# Patient Record
Sex: Female | Born: 1935 | Race: White | Hispanic: No | Marital: Married | Smoking: Never smoker
Health system: Southern US, Community
[De-identification: ages and names within clinical notes are randomized; demographics above are authoritative.]

## PROBLEM LIST (undated history)

## (undated) DIAGNOSIS — H409 Unspecified glaucoma: Secondary | ICD-10-CM

## (undated) DIAGNOSIS — K5792 Diverticulitis of intestine, part unspecified, without perforation or abscess without bleeding: Secondary | ICD-10-CM

## (undated) DIAGNOSIS — F0390 Unspecified dementia without behavioral disturbance: Secondary | ICD-10-CM

## (undated) DIAGNOSIS — N390 Urinary tract infection, site not specified: Secondary | ICD-10-CM

## (undated) HISTORY — PX: OTHER SURGICAL HISTORY: SHX169

## (undated) HISTORY — PX: EYE SURGERY: SHX253

---

## 2011-09-20 ENCOUNTER — Inpatient Hospital Stay: Payer: Self-pay | Admitting: Student

## 2011-09-20 LAB — COMPREHENSIVE METABOLIC PANEL
Alkaline Phosphatase: 62 U/L (ref 50–136)
Anion Gap: 16 (ref 7–16)
Bilirubin,Total: 0.5 mg/dL (ref 0.2–1.0)
Calcium, Total: 8.3 mg/dL — ABNORMAL LOW (ref 8.5–10.1)
Chloride: 97 mmol/L — ABNORMAL LOW (ref 98–107)
Co2: 21 mmol/L (ref 21–32)
Creatinine: 0.75 mg/dL (ref 0.60–1.30)
EGFR (African American): >60
Glucose: 147 mg/dL — ABNORMAL HIGH (ref 65–99)
Osmolality: 273 (ref 275–301)
Potassium: 2.9 mmol/L — ABNORMAL LOW (ref 3.5–5.1)
SGPT (ALT): 37 U/L
Sodium: 134 mmol/L — ABNORMAL LOW (ref 136–145)
Total Protein: 7.3 g/dL (ref 6.4–8.2)

## 2011-09-20 LAB — URINALYSIS, COMPLETE
Glucose,UR: NEGATIVE mg/dL (ref 0–75)
Nitrite: POSITIVE
Ph: 5 (ref 4.5–8.0)
Protein: 100
RBC,UR: 128 /HPF (ref 0–5)
Specific Gravity: 1.021 (ref 1.003–1.030)
WBC UR: 2003 /HPF (ref 0–5)

## 2011-09-20 LAB — CBC
HCT: 42.9 % (ref 35.0–47.0)
HGB: 14.3 g/dL (ref 12.0–16.0)
MCH: 31 pg (ref 26.0–34.0)
MCHC: 33.3 g/dL (ref 32.0–36.0)
MCV: 93 fL (ref 80–100)
RDW: 13.8 % (ref 11.5–14.5)

## 2011-09-20 LAB — PROTIME-INR: INR: 0.9

## 2011-09-20 LAB — LIPASE, BLOOD: Lipase: 69 U/L — ABNORMAL LOW (ref 73–393)

## 2011-09-21 LAB — BASIC METABOLIC PANEL
Anion Gap: 11 (ref 7–16)
BUN: 9 mg/dL (ref 7–18)
Calcium, Total: 7.8 mg/dL — ABNORMAL LOW (ref 8.5–10.1)
Co2: 23 mmol/L (ref 21–32)
EGFR (Non-African Amer.): >60
Potassium: 3 mmol/L — ABNORMAL LOW (ref 3.5–5.1)
Sodium: 133 mmol/L — ABNORMAL LOW (ref 136–145)

## 2011-09-21 LAB — CBC WITH DIFFERENTIAL/PLATELET
Basophil #: 0 10*3/uL (ref 0.0–0.1)
Basophil %: 0.2 %
Lymphocyte #: 2.4 10*3/uL (ref 1.0–3.6)
Lymphocyte %: 12.5 %
MCV: 94 fL (ref 80–100)
Monocyte #: 1.9 10*3/uL — ABNORMAL HIGH (ref 0.0–0.7)
Monocyte %: 9.9 %
Platelet: 173 10*3/uL (ref 150–440)
RDW: 13.6 % (ref 11.5–14.5)
WBC: 19.1 10*3/uL — ABNORMAL HIGH (ref 3.6–11.0)

## 2011-09-22 LAB — BASIC METABOLIC PANEL
Anion Gap: 13 (ref 7–16)
Calcium, Total: 7.5 mg/dL — ABNORMAL LOW (ref 8.5–10.1)
Chloride: 101 mmol/L (ref 98–107)
Co2: 25 mmol/L (ref 21–32)
EGFR (Non-African Amer.): >60
Glucose: 118 mg/dL — ABNORMAL HIGH (ref 65–99)
Osmolality: 276 (ref 275–301)
Potassium: 3.3 mmol/L — ABNORMAL LOW (ref 3.5–5.1)
Sodium: 139 mmol/L (ref 136–145)

## 2011-09-22 LAB — CBC WITH DIFFERENTIAL/PLATELET
Basophil %: 0.1 %
Eosinophil %: 0.1 %
MCH: 31 pg (ref 26.0–34.0)
MCHC: 33.2 g/dL (ref 32.0–36.0)
Monocyte #: 1.7 10*3/uL — ABNORMAL HIGH (ref 0.0–0.7)
Monocyte %: 9.2 %
Neutrophil %: 76.3 %
Platelet: 165 10*3/uL (ref 150–440)
RBC: 4.09 10*6/uL (ref 3.80–5.20)
RDW: 13.9 % (ref 11.5–14.5)
WBC: 18.8 10*3/uL — ABNORMAL HIGH (ref 3.6–11.0)

## 2011-09-23 LAB — CBC WITH DIFFERENTIAL/PLATELET
Basophil %: 0.4 %
Eosinophil #: 0.1 10*3/uL (ref 0.0–0.7)
Eosinophil %: 0.4 %
HCT: 36.6 % (ref 35.0–47.0)
HGB: 12.5 g/dL (ref 12.0–16.0)
Lymphocyte %: 20.3 %
Neutrophil #: 11.6 10*3/uL — ABNORMAL HIGH (ref 1.4–6.5)
Neutrophil %: 71.7 %
WBC: 16.2 10*3/uL — ABNORMAL HIGH (ref 3.6–11.0)

## 2011-09-25 LAB — CBC WITH DIFFERENTIAL/PLATELET
Basophil %: 0.4 %
Eosinophil #: 0.1 10*3/uL (ref 0.0–0.7)
Eosinophil %: 1.5 %
HCT: 37.4 % (ref 35.0–47.0)
Lymphocyte %: 38.4 %
MCH: 31 pg (ref 26.0–34.0)
MCV: 92 fL (ref 80–100)
Monocyte %: 12.2 %
Neutrophil #: 3.7 10*3/uL (ref 1.4–6.5)
Neutrophil %: 47.5 %
RDW: 13.8 % (ref 11.5–14.5)
WBC: 7.7 10*3/uL (ref 3.6–11.0)

## 2012-04-13 DIAGNOSIS — H401190 Primary open-angle glaucoma, unspecified eye, stage unspecified: Secondary | ICD-10-CM | POA: Insufficient documentation

## 2012-04-13 DIAGNOSIS — H2510 Age-related nuclear cataract, unspecified eye: Secondary | ICD-10-CM | POA: Insufficient documentation

## 2012-09-03 DIAGNOSIS — B029 Zoster without complications: Secondary | ICD-10-CM | POA: Insufficient documentation

## 2012-09-03 DIAGNOSIS — R4181 Age-related cognitive decline: Secondary | ICD-10-CM | POA: Insufficient documentation

## 2012-09-03 DIAGNOSIS — I10 Essential (primary) hypertension: Secondary | ICD-10-CM | POA: Insufficient documentation

## 2012-09-03 DIAGNOSIS — K5790 Diverticulosis of intestine, part unspecified, without perforation or abscess without bleeding: Secondary | ICD-10-CM | POA: Insufficient documentation

## 2012-09-14 DIAGNOSIS — Z961 Presence of intraocular lens: Secondary | ICD-10-CM | POA: Insufficient documentation

## 2012-09-22 DIAGNOSIS — H2510 Age-related nuclear cataract, unspecified eye: Secondary | ICD-10-CM | POA: Insufficient documentation

## 2012-10-11 ENCOUNTER — Observation Stay: Payer: Self-pay | Admitting: Internal Medicine

## 2012-10-11 LAB — CBC
HGB: 13.5 g/dL (ref 12.0–16.0)
MCV: 92 fL (ref 80–100)
RDW: 13.7 % (ref 11.5–14.5)
WBC: 9.4 10*3/uL (ref 3.6–11.0)

## 2012-10-11 LAB — MAGNESIUM: Magnesium: 1.8 mg/dL

## 2012-10-11 LAB — BASIC METABOLIC PANEL
Anion Gap: 3 — ABNORMAL LOW (ref 7–16)
BUN: 19 mg/dL — ABNORMAL HIGH (ref 7–18)
Calcium, Total: 8.7 mg/dL (ref 8.5–10.1)
Chloride: 103 mmol/L (ref 98–107)
Osmolality: 278 (ref 275–301)
Potassium: 3.4 mmol/L — ABNORMAL LOW (ref 3.5–5.1)
Sodium: 138 mmol/L (ref 136–145)

## 2012-10-11 LAB — TROPONIN I: Troponin-I: <0.02 ng/mL

## 2012-10-12 DIAGNOSIS — R079 Chest pain, unspecified: Secondary | ICD-10-CM

## 2012-10-12 LAB — LIPID PANEL
Ldl Cholesterol, Calc: 131 mg/dL — ABNORMAL HIGH (ref 0–100)
Triglycerides: 100 mg/dL (ref 0–200)
VLDL Cholesterol, Calc: 20 mg/dL (ref 5–40)

## 2012-10-12 LAB — CBC WITH DIFFERENTIAL/PLATELET
Basophil #: 0.1 10*3/uL (ref 0.0–0.1)
Basophil %: 0.7 %
Eosinophil #: 0.2 10*3/uL (ref 0.0–0.7)
Eosinophil %: 2.3 %
HCT: 38.4 % (ref 35.0–47.0)
Lymphocyte #: 4.2 10*3/uL — ABNORMAL HIGH (ref 1.0–3.6)
Lymphocyte %: 48.5 %
Monocyte #: 0.8 x10 3/mm (ref 0.2–0.9)
Neutrophil %: 39.4 %
Platelet: 198 10*3/uL (ref 150–440)
RBC: 4.11 10*6/uL (ref 3.80–5.20)
RDW: 13.5 % (ref 11.5–14.5)
WBC: 8.7 10*3/uL (ref 3.6–11.0)

## 2012-10-12 LAB — BASIC METABOLIC PANEL
BUN: 17 mg/dL (ref 7–18)
Chloride: 107 mmol/L (ref 98–107)
Co2: 29 mmol/L (ref 21–32)
Creatinine: 0.75 mg/dL (ref 0.60–1.30)
EGFR (Non-African Amer.): >60
Glucose: 106 mg/dL — ABNORMAL HIGH (ref 65–99)

## 2013-11-17 ENCOUNTER — Ambulatory Visit: Payer: PRIVATE HEALTH INSURANCE | Admitting: Podiatrist

## 2013-11-17 ENCOUNTER — Ambulatory Visit (INDEPENDENT_AMBULATORY_CARE_PROVIDER_SITE_OTHER): Payer: Medicare Other | Admitting: Podiatrist

## 2013-11-17 VITALS — BP 149/72 | HR 75 | Resp 16

## 2013-11-17 DIAGNOSIS — M2010 Hallux valgus (acquired), unspecified foot: Secondary | ICD-10-CM

## 2013-11-17 DIAGNOSIS — B351 Tinea unguium: Secondary | ICD-10-CM

## 2013-11-17 DIAGNOSIS — M79609 Pain in unspecified limb: Secondary | ICD-10-CM

## 2013-11-17 DIAGNOSIS — M2040 Other hammer toe(s) (acquired), unspecified foot: Secondary | ICD-10-CM

## 2013-11-17 DIAGNOSIS — M216X9 Other acquired deformities of unspecified foot: Secondary | ICD-10-CM

## 2013-11-17 DIAGNOSIS — L84 Corns and callosities: Secondary | ICD-10-CM

## 2013-11-17 NOTE — Patient Instructions (Signed)
Try wearing the pad between the great toe and the 2nd toe especially in shoes.  Also try out the pad in your shoe insert-- if it uncomfortable, you can move it around to find where it feels comfortable, put it on the bottom of the insert instead of the top or, remove it altogether.

## 2013-11-17 NOTE — Progress Notes (Signed)
Chief Complaint  Patient presents with  . Follow-up    callus, toenails and rotation of the hallux     HPI: Patient is 80 y.o. female who presents today for elongated and painful toenails,  Painful callus submet 2 right, hammertoe 2nd right, and laterally deviated hallux right.  The patients husband accompanies her at todays visit.   He relates the right great to is shifting closer to the 2nd toe and starting to cause her pain.  She in turn has a hammertoe on the 2nd that is worsening and a callus submet 2 right that has become painful. He has cut a hole out of her insert in her shoe to try to relieve the pressure.       Physical Exam GENERAL APPEARANCE: Alert. Appropriately groomed. No acute distress.  VASCULAR: Pedal pulses palpable at 2/4 DP and1/4 PT bilateral.  Capillary refill time is immediate to all digits,  Proximal to distal cooling it warm to warm.   NEUROLOGIC: sensation is intact epicritically and protectively to 5.07 monofilament at 5/5 sites bilateral.  Light touch is intact bilateral, vibratory sensation intact bilateral, achilles tendon reflex is intact bilateral.  MUSCULOSKELETAL: hallux abducto valgus deformity with valgus tilt of the hallux right greater than left.  Contracture of the 2nd toe right is present.  Prominent 2nd metatarsal head with callus deformity present.  3rd toe right is also contracted.  Crowding of the lesser digits is present.    DERMATOLOGIC: callus submet 2 of the right foot is present.  Intact integument is present with debridement.  No other lesions present.  Toenails 1-5 bilateral are thick, yellow, dystrophic and painful with debridement and ambulation.   Assessment: prominent metatarsal 2nd right with callus formation and pain.  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 Sharp debridement of the callus was also accomplished without incident.  I offloaded the 2nd metatarsal  with a cut out pad and discussed surgical options for the 2nd toe including bunion surgery and straightening the 2nd toe versus amputation.  She will be seen back for routine care as needed and will call if the toe becomes painful and she would like to consider her surgical options.

## 2014-05-02 ENCOUNTER — Encounter: Payer: Self-pay | Admitting: Podiatry

## 2014-05-02 ENCOUNTER — Ambulatory Visit (INDEPENDENT_AMBULATORY_CARE_PROVIDER_SITE_OTHER): Payer: Medicare Other | Admitting: Podiatry

## 2014-05-02 VITALS — BP 141/81 | HR 76 | Resp 16

## 2014-05-02 DIAGNOSIS — B351 Tinea unguium: Secondary | ICD-10-CM

## 2014-05-02 DIAGNOSIS — M79676 Pain in unspecified toe(s): Secondary | ICD-10-CM

## 2014-05-02 DIAGNOSIS — L84 Corns and callosities: Secondary | ICD-10-CM | POA: Diagnosis not present

## 2014-05-02 DIAGNOSIS — R21 Rash and other nonspecific skin eruption: Secondary | ICD-10-CM

## 2014-05-02 DIAGNOSIS — M216X1 Other acquired deformities of right foot: Secondary | ICD-10-CM

## 2014-05-02 NOTE — Progress Notes (Signed)
Patient ID: Linda Elliott, female   DOB: [birth year], 80 y.o.   MRN: [id number]  Subjective: Ms. Linda Elliott, 80 year old female, returns fianc with complaints of painful elongated toenails as well as painful callus right foot submetatarsal 2. Patient also has new complaints of a rash on the front of the right ankle for which husband states isn't present for approximately one year and has been intermittent. They previously has seen a dermatologist she was told that it was due to dryness. They have attempted over-the-counter moisturizers and some steroid creams without any resolution. She states that it itches periodically it has not changed much in size or color. Denies any ulceration to the area. No other complaints at this time. Denies any systemic complaints of fevers, chills, nausea, vomiting. No acute changes since last appointment.  Objective: AAO 3, NAD DP/PT pulses palpable bilaterally, CRT less than 3 seconds Protective sensation intact with Simms Weinstein monofilament, vibratory sensation intact, Achilles tendon reflex intact. 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 Hammertoe contracture to the second digit with prominent second metatarsal head plantarly. There is a hyperkeratotic lesion submetatarsal 2 on the right foot. Nails hypertrophic, dystrophic, elongated, brittle, yellow discoloration without any surrounding erythema or drainage. There is a dry, scaly rash on the anterior aspect of the right ankle with a slight erythematous base. There is no ascending cellulitis or increased warmth over the area. No open lesions. No calf pain, swelling, warmth, erythema.  Assessment: 80 year old female with symptoms of chronic onychomycosis, symptomatic right submetatarsal 2 hyperkeratotic lesion and rash right anterior ankle.  Plan: -Treatment options discussed including alternatives, risks, complications. -Nail sharply debrided 10 without complication -Hyperkeratotic lesion sharply  debrided 1 without complications -For the rash on the right anterior ankle discussed using a moisturizer under occlusion. If that does not help relieve symptoms we'll consider Kenalog cream. If symptoms persist we'll consider biopsy. Patient has already seen a dermatologist without any changes. -Follow-up in 3 months or sooner if any problems are to arise or any change in symptoms. In the meantime call the office with any questions, concerns.

## 2014-08-01 ENCOUNTER — Ambulatory Visit (INDEPENDENT_AMBULATORY_CARE_PROVIDER_SITE_OTHER): Payer: Medicare Other | Admitting: Podiatry

## 2014-08-01 DIAGNOSIS — M79676 Pain in unspecified toe(s): Secondary | ICD-10-CM

## 2014-08-01 DIAGNOSIS — R21 Rash and other nonspecific skin eruption: Secondary | ICD-10-CM

## 2014-08-01 DIAGNOSIS — B351 Tinea unguium: Secondary | ICD-10-CM

## 2014-08-01 MED ORDER — CLOTRIMAZOLE-BETAMETHASONE 1-0.05 % EX CREA: 1.0000 "application " | TOPICAL_CREAM | Freq: Two times a day (BID) | CUTANEOUS | Status: DC

## 2014-08-01 NOTE — Progress Notes (Signed)
Patient ID: Linda PonderBetty Elliott, female   DOB: [birth year], 81 y.o.   MRN: [id number]  Subjective: 81 year old female returns to the office today with her husband for painful, elongated, thick toenails. The patient's last husband states that they are unable to trim the nails himself. She states the nails rub in shoes causing discomfort. She denies any recent redness or drainage from the nail sites. She continues to have a mild rash on the anterior aspect of the right ankle for which the patient's husband is is tried numerous moisturizers as well as steroid creams. He states the area has somewhat improved although it does continue. He is inquiring about possible fungal treatment. No other complaints at this time no acute changes since last appointment.  Objective: AAO 3, NAD DP/PT pulses palpable bilaterally, CRT less than 3 seconds Protective sensation appears to be intact with Dorann OuSimms Weinstein monofilament, vibratory sensation intact, Achilles tendon reflex intact. Nails are hypertrophic, dystrophic, lytic, brittle, discolored 10. No swelling erythema or drainage. There subjective tenderness all 10 toenails. There is a dry, scaly rash on the anterior aspect of the right ankle with slight erythematous base which the patient states itches. There is no draining, open lesions. No increase in warmth or edema. Is no signs of bacterial infection. Hyperkeratotic lesion right foot submetatarsal 2 with prominent metatarsal heads bilaterally atrophy of the fat pad. No other open lesions or pre-ulcer lesions identified. No interdigital maceration. Bilateral HAV deformity right greater the left with hammertoe contractures. No pain with calf compression, swelling, warmth, erythema.  Assessment: [age] year old female with symptomatic onychomycosis, symptomatic submetatarsal 2 hyperkeratotic lesion on the right foot, anterior ankle rash on the right possible fungal.  Plan: -Treatment options were discussed the patient  including alternatives, risks, complications. -Nail sharply debrided 10 without complication/bleeding. -Hyperkeratotic lesion sharply debrided right foot 1 without complications bleeding. -As the rash on the anterior aspect of the right ankle was not completely resolved with moisturizers and topical steroids we will try antifungal treatment although would be unlikely for this area. Lotrisone was prescribed. If this is not cleared up will follow-up with dermatology. -Follow-up in 3 months or sooner should any problems arise. In the meantime, call the office with any questions, concerns, changes symptoms

## 2014-09-09 ENCOUNTER — Emergency Department: Payer: Self-pay | Admitting: Emergency Medicine

## 2014-10-27 NOTE — H&P (Signed)
PATIENT NAME:  Linda Elliott, Linda Elliott MR#:  [id number] DATE OF BIRTH:  Mar 31, 1923  DATE OF ADMISSION:  10/11/2012  PRIMARY CARE PHYSICIAN: Glory RosebushLisa Elmrich, MD in [place]   CHIEF COMPLAINT: Chest pain.   HISTORY OF PRESENT ILLNESS: This is an 81 year old female with mild dementia. Most of the history is obtained from the husband, who states that at 3:00 p.m. she was not feeling well. She had a dull ache in her chest, 5 out of 10 in intensity, with difficulty breathing, diaphoretic, no nausea. The pain radiated up into the left neck, jaw and arm, and her blood pressure at that time was 180/80, pulse went up to 84. They gave aspirin and called EMS. The patient does have some residual left shoulder pain. She feels a little hazy and has a dull ache in the left upper chest. No complaints of shortness of breath at this point. No cough. No fever. In the ER, the patient had a cardiac enzyme that was negative. EKG did not show anything acute. Hospitalist services were contacted for further evaluation.   PAST MEDICAL HISTORY: Mild dementia, hypertension.   PAST SURGICAL HISTORY: Cataracts, cholecystectomy and appendectomy.   ALLERGIES: PENICILLIN.   CURRENT MEDICATIONS: Include Namenda 5 mg daily, hydrochlorothiazide 12.5 mg daily.   SOCIAL HISTORY: No smoking. No alcohol. No drug use. She lives with her husband. She was a Runner, broadcasting/film/videoteacher, and she has a PhD in psychology.   FAMILY HISTORY: Mother died at 8085 of a MI, had hypertension and heart disease. Father unknown.   REVIEW OF SYSTEMS: CONSTITUTIONAL: Positive for diaphoresis. No fever. No chills. No weight gain. No weight loss. No fatigue.  EYES: She did have cataract surgery 3hree weeks ago, does wear reading glasses.  EARS, NOSE, MOUTH AND THROAT: No hearing loss. No sore throat. No difficulty swallowing.  CARDIOVASCULAR: Positive for chest pain. No palpitations.  RESPIRATORY: Positive for shortness of breath. No cough. No sputum. No hemoptysis.  GASTROINTESTINAL:  No nausea. No vomiting. No abdominal pain. No diarrhea. No constipation. No bright red blood per rectum. No melena.  GENITOURINARY: No burning on urination, no hematuria.  MUSCULOSKELETAL: Positive for shoulder pain.  INTEGUMENT: No rashes or eruptions.  NEUROLOGICAL: No fainting or blackouts.  PSYCHIATRIC: No anxiety or depression.  ENDOCRINE: No thyroid problems.  HEMATOLOGIC/LYMPHATIC: No anemia. No easy bruising or bleeding.   PHYSICAL EXAMINATION: VITAL SIGNS: Temperature 98.2, pulse 65, respirations 18, blood pressure 156/78, pulse oximetry 97% on room air.  GENERAL: No respiratory distress.  HEENT: Eyes: Conjunctivae and lids normal. Pupils are equal, round, reactive to light. Extraocular muscles are intact. No nystagmus. Ears, nose, mouth, and throat: Tympanic membranes no erythema. Nasal mucosa no erythema. Throat no erythema, no exudate seen. Lips and gums no lesions.  NECK: No JVD. No bruits. No lymphadenopathy. No thyromegaly. No thyroid nodules palpated.  RESPIRATORY: Lungs are clear to auscultation. No use of accessory muscles to breathe. No rhonchi, rales or wheeze heard.  CARDIOVASCULAR: S1, S2 normal. A 2/6 systolic ejection murmur. Carotid upstroke 2+ bilaterally. No bruits.  EXTREMITIES: Dorsalis pedis pulses 2+ bilaterally. No edema of the lower extremity.  ABDOMEN: Soft, nontender. No organomegaly/splenomegaly. Normoactive bowel sounds. No masses felt.  LYMPHATIC: No lymph nodes in the neck.  MUSCULOSKELETAL: No clubbing, edema or cyanosis.  SKIN: No rashes or ulcers seen.  NEUROLOGIC: Cranial nerves II through XII are grossly intact. Deep tendon reflexes 1+ bilateral lower extremity.  PSYCHIATRIC: The patient is oriented to person and place. Answers yes or no questions appropriately.  CHEST WALL: Positive pain to palpation over the left upper chest. Slight pain over the clavicle and shoulder joint.   ASSESSMENT AND PLAN: 1. Chest pain: Husband did give a good story,  but pain is also musculoskeletal, unclear if this is cardiac or not. We will admit as an observation, get serial cardiac enzymes to rule out myocardial infarction. If enzymes are negative, we will get a stress test in the a.m. Aspirin was given by EMS. We will continue aspirin on a daily basis. If cardiac enzymes turn positive, we will consider cardiology consultation. No beta blocker secondary to relative bradycardia.  2. Hypertension: On low-dose hydrochlorothiazide.  3. Mild dementia: On Namenda.  4. Hypokalemia: I will give potassium supplementation and recheck in a.m. Magnesium is normal range.   CODE STATUS: The patient is a DO NOT RESUSCITATE.   TIME SPENT ON ADMISSION: 50 minutes.  ____________________________ Herschell Dimes. Renae Gloss, MD rjw:cb D: 10/11/2012 19:52:24 ET T: 10/11/2012 20:04:34 ET JOB#: [id number]  cc: Herschell Dimes. Renae Gloss, MD, <Dictator> Glory Rosebush, MD in [facility] Jeanella Anton MD ELECTRONICALLY SIGNED 10/12/2012 13:52

## 2014-10-27 NOTE — Discharge Summary (Signed)
PATIENT NAME:  Linda Elliott, Linda Elliott MR#:  [id number] DATE OF BIRTH:  [birth year]  DATE OF ADMISSION:  10/11/2012 DATE OF DISCHARGE:  10/12/2012  PRESENTING COMPLAINT: Chest pain.   DISCHARGE DIAGNOSES: 1.  Chest pain, appears noncardiac, resolved.  2.  Hypertension.  3.  Cognitive decline/early dementia.   CONDITION ON DISCHARGE: Fair.   CODE STATUS: No code, DO NOT RESUSCITATE.   DISCHARGE MEDICATIONS: 1.  Aspirin 81 mg daily.  2.  Namenda 5 mg daily.  3.  Hydrochlorothiazide 12.5 mg daily.   FOLLOW UP:  [facility] in 1 to 2 weeks.   LABORATORY AND DIAGNOSTIC DATA:   1.  Cardiac enzymes x 3 negative.  2.  Myoview stress test within normal limits along with ejection fraction of 55%.  3.  CBC and metabolic panel within normal limits.  4.  LDL 131, cholesterol is 209. 5.  Cardiac enzymes x 3 negative.  6.  Magnesium 1.8.  7.  EKG shows junctional rhythm.   BRIEF SUMMARY OF HOSPITAL COURSE:  The patient is a very pleasant 81 year old Caucasian female with history of hypertension, who comes to the Emergency Room with complaints of chest:   1.  Chest pain. The patient presented with left-sided chest pain radiating to her left arm along with some diaphoresis. No shortness of breath. She was admitted on the medical floor. She remained in sinus rhythm. Cardiac enzymes were negative x 3. She was ruled out for acute myocardial infarction. She remained chest pain free thereafter p.o. P.r.n. nitroglycerin and aspirin were given. She was continued on her hydrochlorothiazide. She was subjected to Myoview stress test which was essentially negative.  2.  Hypertension. Continued low dose hydrochlorothiazide.  3. Mild dementia. Hospital stay otherwise remained stable.  4.  CODE STATUS: The patient remained a no code, DO NOT RESUSCITATE  5.  [facility].   TIME SPENT: 40 minutes.     ____________________________ Wylie HailSona A. Allena KatzPatel,  MD sap:ct D: 10/13/2012 07:32:30 ET T: 10/13/2012 07:43:05 ET JOB#: [id number]  cc: Kimba Lottes A. Allena KatzPatel, MD, <Dictator> Willow OraSONA A Tamina Cyphers MD ELECTRONICALLY SIGNED 10/13/2012 14:19

## 2014-10-29 NOTE — Discharge Summary (Signed)
PATIENT NAME:  Linda Elliott, Linda Elliott MR#:  [id number] DATE OF BIRTH:  [birth year]  DATE OF ADMISSION:  09/20/2011 DATE OF DISCHARGE:  09/25/2011  CONSULTANT: Lurline DelShaukat Iftikhar, MD - Gastroenterology.   CHIEF COMPLAINT: Abdominal pain, nausea, vomiting, and diarrhea.   DISCHARGE DIAGNOSES:  1. Colitis, likely infectious. 2. Hypertension. 3. Diverticulitis. 4. Urinary tract infections. 5. Hypokalemia.   DISCHARGE MEDICATIONS:  1. HCTZ 10 mg daily.  2. Cipro 500 mg twice a day for 4 days. 3. Flagyl 500 mg three times daily for 4 days. 4. Nitrofurantoin 100 mg p.o. twice a day for three days. 5. K-Lor 20 mEq daily for seven days.   DIET: Low sodium, ADA diet.   ACTIVITY: As tolerated.   DISCHARGE FOLLOWUP: Please follow up with your PCP within 1 to 2 weeks and check electrolytes in 1 week.  HISTORY OF PRESENT ILLNESS: For full details, please see history and physical dictated on 09/20/2011, but briefly this is an 81 year old female who came in with the chief complaint as stated above. She was found with colitis per CAT scan and admitted to the hospitalist service for further evaluation and management. Furthermore, she was hypokalemic.   SIGNIFICANT LABS/IMAGING: Initial potassium 2.9, sodium 134, creatinine 0.75, glucose 147. Potassium on discharge 3.9. Magnesium on arrival 1.3. Lipase 69. LFTs: AST 49 otherwise within normal limits. Troponin negative. WBC 10.6 initially then went to 19.1. By discharge it had normalized to 7.7. Hemoglobin on arrival 14.3, hematocrit 42.9, and platelets 166. INR 0.9.  Urine cultures showed Escherichia coli sensitive to nitrofurantoin.   Stool cultures were negative.   Clostridium difficile cultures were negative.   Urinalysis was suggestive of infection with 3+ leukocyte esterase, positive nitrites, 2003 WBCs, and 3+ bacteria.   CT scan of the abdomen and pelvis with contrast showed bowel wall thickening involving the ascending and transverse colon concerning  for colitis secondary to infectious or inflammatory etiology, less likely ischemic. Also haziness around the bladder, possibly cystitis.   HOSPITAL COURSE: The patient was admitted to the hospitalist service and initially started on Cipro and Flagyl and Gastroenterology was consulted. The patient had stool cultures as well as urine cultures sent. The culture for stool has been all negative and the urine cultures grew Escherichia coli. She did have abdominal pain which was generalized, more on the right and also upper epigastric. She was seen by Gastroenterology and the recommendation was to continue the Cipro and Flagyl. Slowly her GI symptoms resolved. She likely had infectious colitis. She was started on clear liquids and advanced. Hemoglobin and hematocrit has been stable. At this point, she has no further symptoms of nausea, vomiting, or diarrhea and is tolerating her p.o. She will be discharged with additional four days of Cipro and Flagyl. In regards to the hypokalemia, that was repleted p.r.n. and she will be discharged on seven days of replacement therapy. The patient also did have a urinary tract infection and she will be discharged on nitrofurantoin for that. The patient has been passing stool and gas and, at this time, she will be discharged with outpatient follow-up.   CODE STATUS: FULL CODE.          DISPOSITION: Home   TOTAL TIME SPENT: 35 minutes.  ____________________________ Krystal EatonShayiq Zamarian Scarano, MD sa:slb D: 09/25/2011 17:05:32 ET T: 09/26/2011 10:08:19 ET JOB#: [id number]  cc: Krystal EatonShayiq Aylana Hirschfeld, MD, <Dictator> Krystal EatonSHAYIQ Ahmet Schank MD ELECTRONICALLY SIGNED 10/12/2011 15:06

## 2014-10-29 NOTE — Consult Note (Signed)
Chief Complaint:   Subjective/Chief Complaint Still c/o mild abdominal pain. No vomiting, diarrhea or bleeding. H and H normal. Stool studies negative for C. diff. Urine c/s consistent with UTI.  Impression: Probable infectious colitis. LGI bleed, resolved most likely hemorrhoidal vs from colitis. No signs of active bleeding.  Recommendations: Continue current antibiotics. Slowly advance diet. patient will require a colonoscopy at a later date. Will follow.   Electronic Signatures: Lurline DelIftikhar, Okechukwu Regnier (MD)  (Signed 18-Mar-13 18:51)  Authored: Chief Complaint   Last Updated: 18-Mar-13 18:51 by Lurline DelIftikhar, Darrill Vreeland (MD)

## 2014-10-29 NOTE — H&P (Signed)
PATIENT NAME:  Linda Elliott, Aury MR#:  [id number] DATE OF BIRTH:  1922/09/03  DATE OF ADMISSION:  09/20/2011  ER REFERRING PHYSICIAN: Dana AllanMarwan Powers, MD  CHIEF COMPLAINT: Abdominal pain, nausea, vomiting, and diarrhea.   HISTORY OF PRESENT ILLNESS: This is an 81 year old female who three days ago started not feeling well. Yesterday she developed some diarrhea with some blood in it last night. She has had a little bit of vomiting and this morning she had some left-sided pain. She has a history of diverticulitis in the past. Her husband who is a physician thought that might be what was going on and brought her into the emergency room. A CT scan showed she had colitis and she is dehydrated and hypokalemic so we are going to go ahead and admit her for further treatment.   PAST MEDICAL HISTORY:  1. Hypertension.  2. Diverticulitis.  PAST SURGICAL HISTORY:  1. Appendectomy.  2. Cholecystectomy.   ALLERGIES: Penicillin.   CURRENT MEDICATIONS: Hydrochlorothiazide 12.5 mg daily.   SOCIAL HISTORY: Does not smoke and does not drink alcohol.   FAMILY HISTORY: Her mother had congestive heart failure.   REVIEW OF SYSTEMS: CONSTITUTIONAL: No fever or chills. EYES: No blurred vision. ENT: No hearing loss. CARDIOVASCULAR: No chest pain. PULMONARY: No shortness of breath. GI: No nausea, vomiting, or diarrhea. GU: No dysuria. ENDOCRINE: No heat or cold intolerance. INTEGUMENT: No rash. MUSCULOSKELETAL: Occasional joint pain. NEUROLOGIC: No numbness or weakness.   PHYSICAL EXAMINATION:   VITAL SIGNS: Temperature 97.1, pulse 75, respirations 18, blood pressure 156/62.   GENERAL: This is a well-nourished white female in no acute distress.   HEENT: The pupils are equal, round, and reactive to light. Sclerae are anicteric. Oral mucosa is moist. Oropharynx is clear. Nasopharynx is clear.   NECK: Supple. No JVD, lymphadenopathy or thyromegaly.   CARDIOVASCULAR: Regular rate and rhythm. No murmurs, rubs, or  gallops.   LUNGS: Clear to auscultation. No dullness to percussion. She is not using accessory muscles.   ABDOMEN: Soft and nondistended. Bowel sounds are positive. No hepatosplenomegaly. No masses. There is some mild tenderness in the left lower quadrant with no rebound or guarding.   EXTREMITIES: No edema.   NEUROLOGIC: Cranial nerves II through XII are intact. She is alert and oriented x4, mildly hard of hearing.   SKIN: Moist with no rash.   LABS/STUDIES: CT scan of the abdomen shows colitis in the transverse and descending colon.   Urinalysis is nitrite positive.   White blood cells 10.6 and hemoglobin 14.3. BUN 18, creatinine 0.75, sodium 134, and potassium 2.9.   ASSESSMENT AND PLAN:  1. Colitis: Suspect this is from infectious source. I will go ahead and put her on IV Cipro and Flagyl, especially since she has had some nausea and vomiting and not able to take p.o. well. We will test her stool for culture and for Clostridium difficile colitis.  2. Hypokalemia: We will give her IV potassium in her fluids. She was given a large dose of p.o. potassium here in the emergency room. We will recheck it. Also check a magnesium.  3. Gastrointestinal bleeding: I suspect this is likely from the colitis. We will monitor her hemoglobin. If it drops would get gastroenterology involved at that point. If not I suspect this will resolve with treatment of the colitis.  4. Urinary tract infection: The Cipro should cover for this. We will go ahead and culture her urine and adjust antibiotics as needed.  5. Hypertension: We will continue her hydrochlorothiazide.  TIME SPENT ON ADMISSION: 45 minutes. ____________________________ Gracelyn Nurse, MD jdj:slb D: 09/20/2011 12:33:42 ET T: 09/20/2011 12:50:30 ET JOB#: [id number]  cc: Gracelyn Nurse, MD, <Dictator> Gracelyn Nurse MD ELECTRONICALLY SIGNED 09/20/2011 15:15

## 2014-10-29 NOTE — Consult Note (Signed)
Brief Consult Note: Diagnosis: Colitis.   Patient was seen by consultant.   Comments: Patient with left sided colitis most likely infectious. Ischemic colitis is less likely. Minimal lower GI bleed either secondary to colitis or more likely hemorrhoidal.  Recommendations: Agree with current antibiotics. Clear liquid diet. Will follow.  Electronic Signatures: Lurline DelIftikhar, Takyra Cantrall (MD)  (Signed 17-Mar-13 19:14)  Authored: Brief Consult Note   Last Updated: 17-Mar-13 19:14 by Lurline DelIftikhar, Markeda Narvaez (MD)

## 2014-10-29 NOTE — Consult Note (Signed)
Chief Complaint:   Subjective/Chief Complaint No diarrhea and no bleeding. WBC count improving. Abdomen is soft and benign.  Recommendations: Advance diet. Continue cipro and flagyl for a total of 10-14 days. Patient to follow with me in 2 weeks after dischargre. Will sign off. Thanks.   Electronic Signatures: Lurline DelIftikhar, Markeisha Mancias (MD)  (Signed 19-Mar-13 22:11)  Authored: Chief Complaint   Last Updated: 19-Mar-13 22:11 by Lurline DelIftikhar, Deantae Shackleton (MD)

## 2014-10-29 NOTE — Consult Note (Signed)
PATIENT NAME:  Linda Elliott, Linda Elliott MR#:  [id number] DATE OF BIRTH:  07/01/1923  DATE OF CONSULTATION:  09/22/2011  REFERRING PHYSICIAN:   CONSULTING PHYSICIAN:  Lurline DelShaukat Abhishek Levesque, MD  REASON FOR CONSULTATION:  Abdominal pain, vomiting, diarrhea, and bleeding.   HISTORY OF PRESENT ILLNESS: An 81 year old female. She was admitted to the hospital day before yesterday, as she was not feeling well and had some diarrhea with bright red blood per rectum. According to the patient, she was in her normal state of health until about a couple of days ago when she started to have some left lower quadrant abdominal pain associated with mild nausea, vomiting, as well as some diarrhea. Some bright red blood was noted in the stool.  A CT scan of the abdomen and pelvis showed probable colitis, mainly in the right colon. Diverticula were noted in the left colon without signs of acute diverticulitis.   PAST MEDICAL HISTORY: History of hypertension and diverticulitis.   PAST SURGICAL HISTORY:  Appendectomy and cholecystectomy. The patient does not believe she has ever had a colonoscopy.   ALLERGIES: Penicillin.  MEDICATIONS AT HOME: Hydrochlorothiazide.   SOCIAL HISTORY: She does not smoke or drink.   FAMILY HISTORY: Grossly unremarkable.   REVIEW OF SYSTEMS: Grossly negative, except for as mentioned in the History of Present Illness.   PHYSICAL EXAMINATION:   GENERAL:  A fairly well-built female, does not appear to be in any acute distress.  Does not appear to be toxic or septic.   VITAL SIGNS: Temperature 97.8, pulse 68, respirations 18, blood pressure 131/76.   SKIN:  Clinically, she does not appear to be anemic. No jaundice was noted.   NECK: Veins are flat.   LUNGS: Clear to auscultation bilaterally with fair air entry and no added sounds.   CARDIOVASCULAR EXAM: Regular rate and rhythm.   ABDOMINAL EXAMINATION: Mild right lower quadrant  tenderness. There is no rebound or guarding. Bowel sounds are  positive.   EXTREMITIES: No edema.  NEUROLOGICAL EXAMINATION: Appears to be unremarkable.   LABORATORY DATA: Her white cell count was normal on admission, which jumped up to 19,000 yesterday and is 18.8 today. Hemoglobin, which was 14.3 on admission, is still normal at 12.7. Electrolytes: BUN and creatinine are unremarkable. Stool culture and stool for C. difficile toxin is negative. Urine cultures are positive for E. coli. CT scan as above.   ASSESSMENT AND PLAN:  1. Urinary tract infection. 2. Diarrhea, nausea, vomiting with probable colitis on the right, most likely an infectious process. Inflammatory bowel disease or ischemic colitis is less likely.  3. Gastrointestinal bleed, most likely secondary to hemorrhoid or colitis. There are no signs of active gastrointestinal bleeding, and her hemoglobin and hematocrit seem to be stable.   RECOMMENDATIONS: I agree with Cipro and Flagyl. If stool cultures and stool for C. differential toxin are negative, clear liquid diet, follow hemoglobin and hematocrit. The patient will require a colonoscopy at a later date. Will follow.   ____________________________ Lurline DelShaukat Sharri Loya, MD si:mln D: 09/22/2011 18:55:42 ET T: 09/23/2011 10:07:21 ET JOB#: [id number] cc: Lurline DelShaukat Janah Mcculloh, MD, <Dictator> Lurline DelSHAUKAT Calea Hribar MD ELECTRONICALLY SIGNED 09/24/2011 8:40

## 2014-10-31 ENCOUNTER — Ambulatory Visit: Payer: Medicare Other

## 2015-01-23 ENCOUNTER — Ambulatory Visit (INDEPENDENT_AMBULATORY_CARE_PROVIDER_SITE_OTHER): Payer: Medicare Other | Admitting: Podiatry

## 2015-01-23 DIAGNOSIS — M79676 Pain in unspecified toe(s): Secondary | ICD-10-CM | POA: Diagnosis not present

## 2015-01-23 DIAGNOSIS — R21 Rash and other nonspecific skin eruption: Secondary | ICD-10-CM

## 2015-01-23 DIAGNOSIS — B351 Tinea unguium: Secondary | ICD-10-CM

## 2015-01-23 DIAGNOSIS — L84 Corns and callosities: Secondary | ICD-10-CM

## 2015-01-23 NOTE — Progress Notes (Signed)
Patient ID: Linda PonderBetty Mccarrell, female   DOB: 12/18/1922, 81 y.o.   MRN: [id number]  Subjective: 81 y.o. female returns the office today for painful, elongated, thickened toenails and calluses which she cannot trim herself. Denies any redness or drainage around the nails/calluses. States the rash on the right anterior ankle is much improved and actually resolved at one point. They have been applying a moisturizer to the area daily which seems to help. Denies any acute changes since last appointment and no new complaints today. Denies any systemic complaints such as fevers, chills, nausea, vomiting.   Objective: AAO 3, NAD; presents with her husband DP/PT pulses palpable, CRT less than 3 seconds Protective sensation intact with Simms Weinstein monofilament, Achilles tendon reflex intact.  Nails hypertrophic, dystrophic, elongated, brittle, discolored 10. There is tenderness overlying the nails 1-5 bilaterally. There is no surrounding erythema or drainage along the nail sites. Hyperkerotic lesion right submetatarsal 1 and 5. Upon debridement, no underlying ulceration, drainage, or signs of infection.  On the anterior right ankle a small area of dry, scaly skin with slight erythematous base. No drainage or ascending cellulitis. No fluctuance ore crepitance. No increase in warmth.  No other open lesions or pre-ulcerative lesions are identified. No other areas of tenderness bilateral lower extremities. No overlying edema, erythema, increased warmth. No pain with calf compression, swelling, warmth, erythema.  Assessment: Patient presents with symptomatic onychomycosis; porokeratosis; anterior right ankle rash.   Plan: -Treatment options including alternatives, risks, complications were discussed -Nails sharply debrided 10 without complication/bleeding. -Hyperkerotic lesion sharply debrided x 2 without complications/bleeding.  -Continue cream for rash as it seems to help -Discussed daily foot inspection.  If there are any changes, to call the office immediately.  -Follow-up in 3 months or sooner if any problems are to arise. In the meantime, encouraged to call the office with any questions, concerns, changes symptoms.   Ovid CurdMatthew Wagoner, DPM

## 2015-04-20 ENCOUNTER — Telehealth: Payer: Self-pay | Admitting: Podiatry

## 2015-04-20 NOTE — Telephone Encounter (Signed)
Pt. Very hard of hearing, will callback later to r/s appt. With pt. husband

## 2015-04-30 ENCOUNTER — Ambulatory Visit: Payer: Medicare Other

## 2015-05-01 ENCOUNTER — Ambulatory Visit (INDEPENDENT_AMBULATORY_CARE_PROVIDER_SITE_OTHER): Payer: Medicare Other | Admitting: Sports Medicine

## 2015-05-01 ENCOUNTER — Encounter: Payer: Self-pay | Admitting: Sports Medicine

## 2015-05-01 DIAGNOSIS — L84 Corns and callosities: Secondary | ICD-10-CM | POA: Diagnosis not present

## 2015-05-01 DIAGNOSIS — B351 Tinea unguium: Secondary | ICD-10-CM | POA: Diagnosis not present

## 2015-05-01 DIAGNOSIS — M79604 Pain in right leg: Secondary | ICD-10-CM

## 2015-05-01 DIAGNOSIS — M79605 Pain in left leg: Secondary | ICD-10-CM

## 2015-05-01 DIAGNOSIS — M79674 Pain in right toe(s): Secondary | ICD-10-CM

## 2015-05-01 DIAGNOSIS — M79675 Pain in left toe(s): Secondary | ICD-10-CM

## 2015-05-01 DIAGNOSIS — R21 Rash and other nonspecific skin eruption: Secondary | ICD-10-CM

## 2015-05-01 NOTE — Progress Notes (Signed)
Patient ID: Linda Elliott, female   DOB: 1922-12-01, 81 y.o.   MRN: [id number] Subjective: Linda Elliott is a 81 y.o. female patient seen today in office with complaint of thickened and elongated toenails; unable to trim. Assisted by husband who is a retired Tax adviser. Patient denies history of Diabetes, Neuropathy, or Vascular disease. Patient has no other pedal complaints at this time.   There are no active problems to display for this patient.  Current Outpatient Prescriptions on File Prior to Visit  Medication Sig Dispense Refill  . clotrimazole-betamethasone (LOTRISONE) cream Apply 1 application topically 2 (two) times daily. 30 g 0   No current facility-administered medications on file prior to visit.   Allergies  Allergen Reactions  . Penicillins Anaphylaxis     Objective: Physical Exam  General: Well developed, nourished, no acute distress, awake, alert and oriented x 3  Vascular: Dorsalis pedis artery 1/4 bilateral, Posterior tibial artery 1/4 bilateral, skin temperature warm to warm proximal to distal bilateral lower extremities, no varicosities, pedal hair present bilateral.  Neurological: Gross sensation present via light touch bilateral.   Dermatological: Skin is warm, dry, and supple bilateral, Nails 1-10 are tender, long, thick, and discolored with mild subungal debris, no webspace macerations present bilateral, no open lesions present bilateral, callus present sub met 2 and 5 on right with no signs of infection bilateral. To right anterior ankle there is a raised, scaly lesion with erythematous base measuring <5cm with no surrounding signs of infection.  Musculoskeletal: Bunion and hammertoes deformities noted Right>left with distal fat pad migration. Muscular strength within normal limits without pain or limitation on range of motion. No pain with calf compression bilateral.  Assessment and Plan:  Problem List Items Addressed This Visit    None    Visit Diagnoses    Dermatophytosis of nail    -  Primary    Corns and callosities        right sub 2 &5     Rash        Slowly improving, Lotrisone    Pain in both lower extremities          Painful Mycotic Nails 1-10  -Examined patient.  -Discussed treatment options for painful mycotic nails. -Mechanically debrided and reduced mycotic nails with sterile nail nipper and dremel nail file without incident. -Debrided callus x 2 on right foot using sterile chisel blade without incident. -Cont. With lotrisone cream daily if fails to improve or worsen will consider biopsy/dermatology consult. -Patient to return in 3 months for follow up evaluation or sooner if symptoms worsen.  Linda Elliott, DPM

## 2015-08-03 ENCOUNTER — Ambulatory Visit (INDEPENDENT_AMBULATORY_CARE_PROVIDER_SITE_OTHER): Payer: Medicare HMO | Admitting: Sports Medicine

## 2015-08-03 ENCOUNTER — Encounter: Payer: Self-pay | Admitting: Sports Medicine

## 2015-08-03 DIAGNOSIS — L84 Corns and callosities: Secondary | ICD-10-CM | POA: Diagnosis not present

## 2015-08-03 DIAGNOSIS — B351 Tinea unguium: Secondary | ICD-10-CM | POA: Diagnosis not present

## 2015-08-03 DIAGNOSIS — M79676 Pain in unspecified toe(s): Secondary | ICD-10-CM

## 2015-08-03 DIAGNOSIS — R21 Rash and other nonspecific skin eruption: Secondary | ICD-10-CM

## 2015-08-03 NOTE — Progress Notes (Signed)
Patient ID: Linda Elliott, female   DOB: [birth year], 82 y.o.   MRN: [id number]  Subjective: Linda Elliott is a 82 y.o. female patient seen today in office with complaint of thickened and elongated toenails; unable to trim. Assisted by husband who is a retired Orthoptist. Patient denies history of Diabetes, Neuropathy, or Vascular disease. Patient has no other pedal complaints at this time.   There are no active problems to display for this patient.  Current Outpatient Prescriptions on File Prior to Visit  Medication Sig Dispense Refill  . clotrimazole-betamethasone (LOTRISONE) cream Apply 1 application topically 2 (two) times daily. 30 g 0   No current facility-administered medications on file prior to visit.   Allergies  Allergen Reactions  . Penicillins Anaphylaxis     Objective: Physical Exam  General: Well developed, nourished, no acute distress, awake, alert and oriented x 3  Vascular: Dorsalis pedis artery 1/4 bilateral, Posterior tibial artery 1/4 bilateral, skin temperature warm to warm proximal to distal bilateral lower extremities, no varicosities, pedal hair present bilateral.  Neurological: Gross sensation present via light touch bilateral.   Dermatological: Skin is warm, dry, and supple bilateral, Nails 1-10 are tender, long, thick, and discolored with mild subungal debris, no webspace macerations present bilateral, no open lesions present bilateral, callus present sub met 2 and 5 on right with no signs of infection bilateral. To right anterior ankle previous rash appears resolve.  Musculoskeletal: Bunion and hammertoes deformities noted Right>left with distal fat pad migration. Muscular strength within normal limits without pain on range of motion. No pain with calf compression bilateral.  Assessment and Plan:  Problem List Items Addressed This Visit    None    Visit Diagnoses    Dermatophytosis of nail    -  Primary    Corns and callosities        Pain of toe,  unspecified laterality        Rash        resolved       -Examined patient.  -Discussed treatment options for painful mycotic nails and callus. -Mechanically debrided and reduced mycotic nails with sterile nail nipper and dremel nail file without incident. -Debrided callus x 2 on right foot using sterile chisel blade without incident. -Recommend good supportive shoes for foot type daily -Patient to return in 3 months for follow up evaluation or sooner if symptoms worsen.  [name], DPM

## 2015-11-02 ENCOUNTER — Ambulatory Visit (INDEPENDENT_AMBULATORY_CARE_PROVIDER_SITE_OTHER): Payer: Medicare HMO | Admitting: Sports Medicine

## 2015-11-02 ENCOUNTER — Encounter: Payer: Self-pay | Admitting: Sports Medicine

## 2015-11-02 DIAGNOSIS — L84 Corns and callosities: Secondary | ICD-10-CM | POA: Diagnosis not present

## 2015-11-02 DIAGNOSIS — M79676 Pain in unspecified toe(s): Secondary | ICD-10-CM | POA: Diagnosis not present

## 2015-11-02 DIAGNOSIS — B351 Tinea unguium: Secondary | ICD-10-CM

## 2015-11-03 NOTE — Progress Notes (Signed)
Patient ID: Linda Elliott, female   DOB: 12-Oct-1922, 82 y.o.   MRN: [id number]  Subjective: Linda Elliott is a 82 y.o. female patient seen today in office with complaint of thickened and elongated toenails; unable to trim. Assisted by husband who is a retired Orthoptist. Patient denies any changes with medical history since last visit. Patient has no other pedal complaints at this time.   Patient Active Problem List   Diagnosis Date Noted  . Cataract, nuclear sclerotic senile 09/22/2012  . Pseudoaphakia 09/14/2012  . Age-related cognitive decline 09/03/2012  . DD (diverticular disease) 09/03/2012  . BP (high blood pressure) 09/03/2012  . Herpes zona 09/03/2012  . Nuclear sclerotic cataract 04/13/2012  . Primary open angle glaucoma 04/13/2012   Current Outpatient Prescriptions on File Prior to Visit  Medication Sig Dispense Refill  . bimatoprost (LUMIGAN) 0.01 % SOLN Apply to eye.    . brimonidine (ALPHAGAN) 0.2 % ophthalmic solution Apply to eye.    . clotrimazole-betamethasone (LOTRISONE) cream Apply 1 application topically 2 (two) times daily. 30 g 0  . dorzolamide (TRUSOPT) 2 % ophthalmic solution Apply to eye.    . dorzolamide-timolol (COSOPT) 22.3-6.8 MG/ML ophthalmic solution Apply to eye.     No current facility-administered medications on file prior to visit.   Allergies  Allergen Reactions  . Penicillins Anaphylaxis     Objective: Physical Exam  General: Well developed, nourished, no acute distress, awake, alert and oriented x 3  Vascular: Dorsalis pedis artery 1/4 bilateral, Posterior tibial artery 1/4 bilateral, skin temperature warm to warm proximal to distal bilateral lower extremities, no varicosities, pedal hair present bilateral.  Neurological: Gross sensation present via light touch bilateral.   Dermatological: Skin is warm, dry, and supple bilateral, Nails 1-10 are tender, long, thick, and discolored with mild subungal debris, no webspace macerations present  bilateral, no open lesions present bilateral, callus present sub met 2 and 5 on right with no signs of infection bilateral.   Musculoskeletal: Bunion and hammertoes deformities noted Right>left with distal fat pad migration. Muscular strength within normal limits without pain on range of motion. No pain with calf compression bilateral.  Assessment and Plan:  Problem List Items Addressed This Visit    None    Visit Diagnoses    Dermatophytosis of nail    -  Primary    Corns and callosities        Pain of toe, unspecified laterality           -Examined patient.  -Discussed treatment options for painful mycotic nails and callus. -Mechanically debrided and reduced mycotic nails with sterile nail nipper and dremel nail file without incident. -Debrided callus x 2 on right foot using sterile chisel blade without incident. -Recommend good supportive shoes for foot type daily -Patient to return in 3 months for follow up evaluation or sooner if symptoms worsen.  [name], DPM

## 2016-02-01 ENCOUNTER — Ambulatory Visit: Payer: Medicare HMO | Admitting: Sports Medicine

## 2016-03-04 ENCOUNTER — Ambulatory Visit (INDEPENDENT_AMBULATORY_CARE_PROVIDER_SITE_OTHER): Payer: Medicare HMO | Admitting: Sports Medicine

## 2016-03-04 ENCOUNTER — Encounter: Payer: Self-pay | Admitting: Sports Medicine

## 2016-03-04 DIAGNOSIS — M79676 Pain in unspecified toe(s): Secondary | ICD-10-CM

## 2016-03-04 DIAGNOSIS — B351 Tinea unguium: Secondary | ICD-10-CM | POA: Diagnosis not present

## 2016-03-04 DIAGNOSIS — L84 Corns and callosities: Secondary | ICD-10-CM

## 2016-03-04 NOTE — Progress Notes (Signed)
Patient ID: [name], female   DOB: 03/19/1923, 82 y.o.   MRN: [id number]  Subjective: Linda Elliott is a 82 y.o. female patient seen today in office with complaint of thickened and elongated toenails; unable to trim. Assisted by husband who is a retired Orthoptist. Patient denies any changes with medical history since last visit. Patient has no other pedal complaints at this time.   Patient Active Problem List   Diagnosis Date Noted  . Cataract, nuclear sclerotic senile 09/22/2012  . Pseudoaphakia 09/14/2012  . Age-related cognitive decline 09/03/2012  . DD (diverticular disease) 09/03/2012  . BP (high blood pressure) 09/03/2012  . Herpes zona 09/03/2012  . Nuclear sclerotic cataract 04/13/2012  . Primary open angle glaucoma 04/13/2012   Current Outpatient Prescriptions on File Prior to Visit  Medication Sig Dispense Refill  . bimatoprost (LUMIGAN) 0.01 % SOLN Apply to eye.    . brimonidine (ALPHAGAN) 0.2 % ophthalmic solution Apply to eye.    . clotrimazole-betamethasone (LOTRISONE) cream Apply 1 application topically 2 (two) times daily. 30 g 0  . dorzolamide (TRUSOPT) 2 % ophthalmic solution Apply to eye.    . dorzolamide-timolol (COSOPT) 22.3-6.8 MG/ML ophthalmic solution Apply to eye.     No current facility-administered medications on file prior to visit.    Allergies  Allergen Reactions  . Penicillins Anaphylaxis    All cillins     Objective: Physical Exam  General: Well developed, nourished, no acute distress, awake, alert and oriented x 3  Vascular: Dorsalis pedis artery 1/4 bilateral, Posterior tibial artery 1/4 bilateral, skin temperature warm to warm proximal to distal bilateral lower extremities, no varicosities, pedal hair present bilateral.  Neurological: Gross sensation present via light touch bilateral.   Dermatological: Skin is warm, dry, and supple bilateral, Nails 1-10 are tender, long, thick, and discolored with mild subungal debris, no webspace  macerations present bilateral, no open lesions present bilateral, callus present sub met 2 on right with no signs of infection bilateral.   Musculoskeletal: Bunion and hammertoes deformities noted Right>left with distal fat pad migration. Muscular strength within normal limits without pain on range of motion. No pain with calf compression bilateral.  Assessment and Plan:  Problem List Items Addressed This Visit    None    Visit Diagnoses    Dermatophytosis of nail    -  Primary   Corns and callosities       Pain of toe, unspecified laterality          -Examined patient.  -Discussed treatment options for painful mycotic nails and callus. -Mechanically debrided and reduced mycotic nails with sterile nail nipper and dremel nail file without incident. -Debrided callus x 1 on right foot using sterile chisel blade without incident. -Recommend good supportive shoes for foot type daily -Patient to return in 3 months for follow up evaluation or sooner if symptoms worsen.  [name], DPM

## 2016-05-07 ENCOUNTER — Encounter
Admission: RE | Admit: 2016-05-07 | Discharge: 2016-05-07 | Disposition: A | Discharge status: Home / Self Care | Payer: Medicare Other | Source: Ambulatory Visit | Attending: Internal Medicine | Admitting: Internal Medicine

## 2016-06-06 ENCOUNTER — Ambulatory Visit: Payer: Medicare HMO | Admitting: Podiatry

## 2016-06-06 ENCOUNTER — Encounter
Admission: RE | Admit: 2016-06-06 | Discharge: 2016-06-06 | Disposition: A | Discharge status: Home / Self Care | Payer: Medicare Other | Source: Ambulatory Visit | Attending: Internal Medicine | Admitting: Internal Medicine

## 2016-07-07 ENCOUNTER — Encounter
Admission: RE | Admit: 2016-07-07 | Discharge: 2016-07-07 | Disposition: A | Discharge status: Home / Self Care | Payer: Medicare HMO | Source: Ambulatory Visit | Attending: Internal Medicine | Admitting: Internal Medicine

## 2016-07-07 DIAGNOSIS — R410 Disorientation, unspecified: Secondary | ICD-10-CM | POA: Insufficient documentation

## 2016-07-10 DIAGNOSIS — R410 Disorientation, unspecified: Secondary | ICD-10-CM | POA: Diagnosis present

## 2016-07-10 LAB — CBC WITH DIFFERENTIAL/PLATELET
BASOS ABS: 0 10*3/uL (ref 0–0.1)
BASOS PCT: 0 %
EOS PCT: 1 %
Eosinophils Absolute: 0.1 10*3/uL (ref 0–0.7)
HEMATOCRIT: 37.4 % (ref 35.0–47.0)
Hemoglobin: 12.7 g/dL (ref 12.0–16.0)
LYMPHS PCT: 45 %
Lymphs Abs: 3 10*3/uL (ref 1.0–3.6)
MCH: 31.2 pg (ref 26.0–34.0)
MCHC: 34.1 g/dL (ref 32.0–36.0)
MCV: 91.8 fL (ref 80.0–100.0)
Monocytes Absolute: 0.7 10*3/uL (ref 0.2–0.9)
Monocytes Relative: 10 %
NEUTROS ABS: 3 10*3/uL (ref 1.4–6.5)
Neutrophils Relative %: 44 %
PLATELETS: 203 10*3/uL (ref 150–440)
RBC: 4.07 MIL/uL (ref 3.80–5.20)
RDW: 14.1 % (ref 11.5–14.5)
WBC: 6.8 10*3/uL (ref 3.6–11.0)

## 2016-07-10 LAB — COMPREHENSIVE METABOLIC PANEL
ALBUMIN: 3.3 g/dL — AB (ref 3.5–5.0)
ALT: 12 U/L — AB (ref 14–54)
AST: 20 U/L (ref 15–41)
Alkaline Phosphatase: 64 U/L (ref 38–126)
Anion gap: 8 (ref 5–15)
BUN: 15 mg/dL (ref 6–20)
CHLORIDE: 104 mmol/L (ref 101–111)
CO2: 26 mmol/L (ref 22–32)
CREATININE: 0.64 mg/dL (ref 0.44–1.00)
Calcium: 8.6 mg/dL — ABNORMAL LOW (ref 8.9–10.3)
GFR calc Af Amer: >60 mL/min (ref >60)
GLUCOSE: 83 mg/dL (ref 65–99)
POTASSIUM: 4.1 mmol/L (ref 3.5–5.1)
Sodium: 138 mmol/L (ref 135–145)
Total Bilirubin: 0.7 mg/dL (ref 0.3–1.2)
Total Protein: 6.1 g/dL — ABNORMAL LOW (ref 6.5–8.1)

## 2016-07-10 LAB — VITAMIN B12: Vitamin B-12: 183 pg/mL (ref 180–914)

## 2016-07-10 LAB — TSH: TSH: 1.518 u[IU]/mL (ref 0.350–4.500)

## 2016-07-25 DIAGNOSIS — R410 Disorientation, unspecified: Secondary | ICD-10-CM | POA: Diagnosis not present

## 2016-07-25 LAB — URINALYSIS, COMPLETE (UACMP) WITH MICROSCOPIC
Bacteria, UA: NONE SEEN
Bilirubin Urine: NEGATIVE
GLUCOSE, UA: NEGATIVE mg/dL
HGB URINE DIPSTICK: NEGATIVE
Ketones, ur: NEGATIVE mg/dL
NITRITE: NEGATIVE
PROTEIN: NEGATIVE mg/dL
SPECIFIC GRAVITY, URINE: 1.014 (ref 1.005–1.030)
pH: 6 (ref 5.0–8.0)

## 2016-07-27 LAB — URINE CULTURE

## 2016-08-07 ENCOUNTER — Encounter
Admission: RE | Admit: 2016-08-07 | Discharge: 2016-08-07 | Disposition: A | Discharge status: Home / Self Care | Payer: Medicare Other | Source: Ambulatory Visit | Attending: Internal Medicine | Admitting: Internal Medicine

## 2016-08-23 ENCOUNTER — Emergency Department: Payer: Medicare HMO

## 2016-08-23 ENCOUNTER — Encounter: Payer: Self-pay | Admitting: Emergency Medicine

## 2016-08-23 ENCOUNTER — Emergency Department
Admission: EM | Admit: 2016-08-23 | Discharge: 2016-08-23 | Disposition: A | Discharge status: Home / Self Care | Payer: Medicare HMO | Attending: Student in an Organized Health Care Education/Training Program | Admitting: Student in an Organized Health Care Education/Training Program

## 2016-08-23 DIAGNOSIS — R930 Abnormal findings on diagnostic imaging of skull and head, not elsewhere classified: Secondary | ICD-10-CM | POA: Insufficient documentation

## 2016-08-23 DIAGNOSIS — R935 Abnormal findings on diagnostic imaging of other abdominal regions, including retroperitoneum: Secondary | ICD-10-CM

## 2016-08-23 DIAGNOSIS — R1084 Generalized abdominal pain: Secondary | ICD-10-CM | POA: Diagnosis present

## 2016-08-23 DIAGNOSIS — Z79899 Other long term (current) drug therapy: Secondary | ICD-10-CM | POA: Insufficient documentation

## 2016-08-23 HISTORY — DX: Urinary tract infection, site not specified: N39.0

## 2016-08-23 HISTORY — DX: Diverticulitis of intestine, part unspecified, without perforation or abscess without bleeding: K57.92

## 2016-08-23 LAB — CBC WITH DIFFERENTIAL/PLATELET
BASOS ABS: 0 10*3/uL (ref 0–0.1)
BASOS PCT: 1 %
EOS ABS: 0.2 10*3/uL (ref 0–0.7)
Eosinophils Relative: 3 %
HCT: 38.6 % (ref 35.0–47.0)
HEMOGLOBIN: 12.6 g/dL (ref 12.0–16.0)
Lymphocytes Relative: 37 %
Lymphs Abs: 1.9 10*3/uL (ref 1.0–3.6)
MCH: 30.6 pg (ref 26.0–34.0)
MCHC: 32.8 g/dL (ref 32.0–36.0)
MCV: 93.3 fL (ref 80.0–100.0)
MONOS PCT: 20 %
Monocytes Absolute: 1 10*3/uL — ABNORMAL HIGH (ref 0.2–0.9)
NEUTROS PCT: 39 %
Neutro Abs: 1.9 10*3/uL (ref 1.4–6.5)
Platelets: 166 10*3/uL (ref 150–440)
RBC: 4.14 MIL/uL (ref 3.80–5.20)
RDW: 14.3 % (ref 11.5–14.5)
WBC: 5 10*3/uL (ref 3.6–11.0)

## 2016-08-23 LAB — INFLUENZA PANEL BY PCR (TYPE A & B)
Influenza A By PCR: NEGATIVE
Influenza B By PCR: NEGATIVE

## 2016-08-23 LAB — URINALYSIS, COMPLETE (UACMP) WITH MICROSCOPIC
Bilirubin Urine: NEGATIVE
GLUCOSE, UA: NEGATIVE mg/dL
HGB URINE DIPSTICK: NEGATIVE
Ketones, ur: 5 mg/dL — AB
Leukocytes, UA: NEGATIVE
NITRITE: NEGATIVE
Protein, ur: NEGATIVE mg/dL
SPECIFIC GRAVITY, URINE: 1.014 (ref 1.005–1.030)
pH: 7 (ref 5.0–8.0)

## 2016-08-23 LAB — TROPONIN I: Troponin I: <0.03 ng/mL (ref <0.03)

## 2016-08-23 LAB — COMPREHENSIVE METABOLIC PANEL
ALK PHOS: 69 U/L (ref 38–126)
ALT: 13 U/L — ABNORMAL LOW (ref 14–54)
ANION GAP: 6 (ref 5–15)
AST: 27 U/L (ref 15–41)
Albumin: 3.9 g/dL (ref 3.5–5.0)
BILIRUBIN TOTAL: 0.9 mg/dL (ref 0.3–1.2)
BUN: 11 mg/dL (ref 6–20)
CALCIUM: 8.6 mg/dL — AB (ref 8.9–10.3)
CO2: 26 mmol/L (ref 22–32)
CREATININE: 0.94 mg/dL (ref 0.44–1.00)
Chloride: 103 mmol/L (ref 101–111)
GFR calc Af Amer: 60 mL/min — ABNORMAL LOW (ref >60)
GFR, EST NON AFRICAN AMERICAN: 51 mL/min — AB (ref >60)
Glucose, Bld: 105 mg/dL — ABNORMAL HIGH (ref 65–99)
Potassium: 5 mmol/L (ref 3.5–5.1)
Sodium: 135 mmol/L (ref 135–145)
TOTAL PROTEIN: 6.9 g/dL (ref 6.5–8.1)

## 2016-08-23 LAB — LIPASE, BLOOD: LIPASE: 20 U/L (ref 11–51)

## 2016-08-23 MED ORDER — IOPAMIDOL (ISOVUE-300) INJECTION 61%
100.0000 mL | Freq: Once | INTRAVENOUS | Status: AC | PRN
  Administered 2016-08-23: 100 mL via INTRAVENOUS

## 2016-08-23 MED ORDER — SODIUM CHLORIDE 0.9 % IV BOLUS (SEPSIS)
500.0000 mL | Freq: Once | INTRAVENOUS | Status: AC
  Administered 2016-08-23: 500 mL via INTRAVENOUS

## 2016-08-23 NOTE — ED Notes (Signed)
Pt assisted to ambulate to bathroom and back; pt does not appear to be in any distress at this time; husband at bedside.

## 2016-08-23 NOTE — ED Notes (Signed)
Assisted pt to ambulate to the bathroom and back to the bed; pt requested that she needed to urinate; pt has been cleaned; pt is repeating herself, repeating phrase "I didn't plan for this today!" and asking "What did I do to bring all this on?" Husband at bedside states pt dementia appears to be worsening, perhaps due to sundowning. Call bell within reach, pt does not appear to be in distress at this time.

## 2016-08-23 NOTE — Discharge Instructions (Signed)
Please follow up with Dr. Dareen PianoAnderson regarding abnormal pancreas finding on your CT scan today. He may wish to obtain additional labs or imaging. The CT scan of your brain shows no evidence of traumatic injury.  Please return should you have any additional questions or concerns.

## 2016-08-23 NOTE — ED Provider Notes (Addendum)
[facility] Emergency Department Provider Note    First MD Initiated Contact with Patient 08/23/16 1745     (approximate)  I have reviewed the triage vital signs and the nursing notes.   HISTORY  Chief Complaint Abdominal Pain    HPI Linda Elliott is a 83 y.o. female with a history of dementia as well as diverticular disease elevated blood pressure and status post cholecystectomy and appendectomy presents with 1 day of generalized abdominal pain and elevated blood pressure. Denies any chest pain or shortness of breath. No report of trauma the patient does have scattered bruises to bilateral upper extremities and is not on any blood thinners. Patient arrives with her husband who is a semiretired physician working at [employer]. States that he evaluated the patient washes at memory care today. Noted that her blood pressure was elevated and was mildly tachycardic but otherwise in no acute distress. States that she did move her bowels this morning. No reported diarrhea. No measured fevers.   Past Medical History:  Diagnosis Date  . Diverticulitis   . UTI (urinary tract infection)    History reviewed. No pertinent family history. Past Surgical History:  Procedure Laterality Date  . EYE SURGERY     Patient Active Problem List   Diagnosis Date Noted  . Cataract, nuclear sclerotic senile 09/22/2012  . Pseudoaphakia 09/14/2012  . Age-related cognitive decline 09/03/2012  . DD (diverticular disease) 09/03/2012  . BP (high blood pressure) 09/03/2012  . Herpes zona 09/03/2012  . Nuclear sclerotic cataract 04/13/2012  . Primary open angle glaucoma 04/13/2012      Prior to Admission medications   Medication Sig Start Date End Date Taking? Authorizing Provider  bimatoprost (LUMIGAN) 0.01 % SOLN Place 1 drop into the left eye at bedtime.   Yes Historical Provider, MD  brimonidine (ALPHAGAN) 0.2 % ophthalmic solution Place into the left eye 2 (two) times daily.   Yes  Historical Provider, MD  donepezil (ARICEPT) 5 MG tablet Take 5 mg by mouth at bedtime.   Yes Historical Provider, MD  dorzolamide-timolol (COSOPT) 22.3-6.8 MG/ML ophthalmic solution Place 1 drop into the left eye 2 (two) times daily.   Yes Historical Provider, MD  lamoTRIgine (LAMICTAL) 25 MG tablet Take 50 mg by mouth 2 (two) times daily.   Yes Historical Provider, MD  LORazepam (ATIVAN) 0.5 MG tablet Take 0.5 mg by mouth daily. 1700   Yes Historical Provider, MD  LORazepam (ATIVAN) 0.5 MG tablet Take 0.5 mg by mouth 2 (two) times daily as needed for anxiety.   Yes Historical Provider, MD  traZODone (DESYREL) 50 MG tablet Take 50 mg by mouth at bedtime.   Yes Historical Provider, MD  vitamin B-12 (CYANOCOBALAMIN) 1000 MCG tablet Take 1,000 mcg by mouth daily.   Yes Historical Provider, MD    Allergies Penicillins    Social History Social History  Substance Use Topics  . Smoking status: Never Smoker  . Smokeless tobacco: Never Used  . Alcohol use No    Review of Systems Patient denies headaches, rhinorrhea, blurry vision, numbness, shortness of breath, chest pain, edema, cough, abdominal pain, nausea, vomiting, diarrhea, dysuria, fevers, rashes or hallucinations unless otherwise stated above in HPI. ____________________________________________   PHYSICAL EXAM:  VITAL SIGNS: Vitals:   08/23/16 1930 08/23/16 2000  BP: (!) 172/73 (!) 165/57  Pulse: 78 73  Resp: 19 (!) 23  Temp:      Constitutional: Alert  Elderly and frail appearing but in no acute distress. Eyes:  Conjunctivae are normal. PERRL. EOMI. Head: Atraumatic. Nose: No congestion/rhinnorhea. Mouth/Throat: Mucous membranes are moist.  Oropharynx non-erythematous. Neck: No stridor. Painless ROM. No cervical spine tenderness to palpation Hematological/Lymphatic/Immunilogical: No cervical lymphadenopathy. Cardiovascular: Normal rate, regular rhythm. Grossly normal heart sounds.  Good peripheral  circulation. Respiratory: Normal respiratory effort.  No retractions. Lungs CTAB. Gastrointestinal: Soft and nontender. No distention. No abdominal bruits. No CVA tenderness. Musculoskeletal: No lower extremity tenderness nor edema.  No joint effusions. Neurologic:  Normal speech and language. No gross focal neurologic deficits are appreciated. No facial droop Skin:  Skin is warm, dry and intact. No rash noted. Psychiatric: Mood and affect are normal. Speech and behavior are normal.  ____________________________________________   LABS (all labs ordered are listed, but only abnormal results are displayed)  Results for orders placed or performed during the hospital encounter of 08/23/16 (from the past 24 hour(s))  Comprehensive metabolic panel     Status: Abnormal   Collection Time: 08/23/16  6:31 PM  Result Value Ref Range   Sodium 135 135 - 145 mmol/L   Potassium 5.0 3.5 - 5.1 mmol/L   Chloride 103 101 - 111 mmol/L   CO2 26 22 - 32 mmol/L   Glucose, Bld 105 (H) 65 - 99 mg/dL   BUN 11 6 - 20 mg/dL   Creatinine, Ser 1.610.94 0.44 - 1.00 mg/dL   Calcium 8.6 (L) 8.9 - 10.3 mg/dL   Total Protein 6.9 6.5 - 8.1 g/dL   Albumin 3.9 3.5 - 5.0 g/dL   AST 27 15 - 41 U/L   ALT 13 (L) 14 - 54 U/L   Alkaline Phosphatase 69 38 - 126 U/L   Total Bilirubin 0.9 0.3 - 1.2 mg/dL   GFR calc non Af Amer 51 (L) >60 mL/min   GFR calc Af Amer 60 (L) >60 mL/min   Anion gap 6 5 - 15  CBC with Differential/Platelet     Status: Abnormal   Collection Time: 08/23/16  6:31 PM  Result Value Ref Range   WBC 5.0 3.6 - 11.0 K/uL   RBC 4.14 3.80 - 5.20 MIL/uL   Hemoglobin 12.6 12.0 - 16.0 g/dL   HCT 09.638.6 04.535.0 - 40.947.0 %   MCV 93.3 80.0 - 100.0 fL   MCH 30.6 26.0 - 34.0 pg   MCHC 32.8 32.0 - 36.0 g/dL   RDW 81.114.3 91.411.5 - 78.214.5 %   Platelets 166 150 - 440 K/uL   Neutrophils Relative % 39 %   Neutro Abs 1.9 1.4 - 6.5 K/uL   Lymphocytes Relative 37 %   Lymphs Abs 1.9 1.0 - 3.6 K/uL   Monocytes Relative 20 %    Monocytes Absolute 1.0 (H) 0.2 - 0.9 K/uL   Eosinophils Relative 3 %   Eosinophils Absolute 0.2 0 - 0.7 K/uL   Basophils Relative 1 %   Basophils Absolute 0.0 0 - 0.1 K/uL  Lipase, blood     Status: None   Collection Time: 08/23/16  6:31 PM  Result Value Ref Range   Lipase 20 11 - 51 U/L  Troponin I     Status: None   Collection Time: 08/23/16  6:31 PM  Result Value Ref Range   Troponin I <0.03 <0.03 ng/mL  Urinalysis, Complete w Microscopic     Status: Abnormal   Collection Time: 08/23/16  6:32 PM  Result Value Ref Range   Color, Urine YELLOW (A) YELLOW   APPearance CLEAR (A) CLEAR   Specific Gravity, Urine 1.014 1.005 -  1.030   pH 7.0 5.0 - 8.0   Glucose, UA NEGATIVE NEGATIVE mg/dL   Hgb urine dipstick NEGATIVE NEGATIVE   Bilirubin Urine NEGATIVE NEGATIVE   Ketones, ur 5 (A) NEGATIVE mg/dL   Protein, ur NEGATIVE NEGATIVE mg/dL   Nitrite NEGATIVE NEGATIVE   Leukocytes, UA NEGATIVE NEGATIVE   RBC / HPF 0-5 0 - 5 RBC/hpf   WBC, UA 0-5 0 - 5 WBC/hpf   Bacteria, UA RARE (A) NONE SEEN   Squamous Epithelial / LPF 0-5 (A) NONE SEEN  Influenza panel by PCR (type A & B)     Status: None   Collection Time: 08/23/16  6:32 PM  Result Value Ref Range   Influenza A By PCR NEGATIVE NEGATIVE   Influenza B By PCR NEGATIVE NEGATIVE   ____________________________________________  EKG My review and personal interpretation at Time: 17:51   Indication: epigastric pain  Rate: 75  Rhythm: sinus Axis: normal Other: lbbb with no sgarbossa criteria, non specific st changes, no STEMI ____________________________________________  RADIOLOGY  I personally reviewed all radiographic images ordered to evaluate for the above acute complaints and reviewed radiology reports and findings.  These findings were personally discussed with the patient.  Please see medical record for radiology report.  ____________________________________________   PROCEDURES  Procedure(s) performed:   Procedures    Critical Care performed: no ____________________________________________   INITIAL IMPRESSION / ASSESSMENT AND PLAN / ED COURSE  Pertinent labs & imaging results that were available during my care of the patient were reviewed by me and considered in my medical decision making (see chart for details).  DDX: acs, pna, sbo, colitis, diverticulitis, uti, dehydration  Linda Elliott is a 83 y.o. who presents to the ED with complaint of abdominal pain and epigastric discomfort is burning in nature that started earlier today. Patient arrives afebrile hemodynamic stable. Not in any respiratory distress. Her abdominal exam is benign at this time but based on her age will obtain laboratory evaluation and CT imaging to evaluate for any acute intra-abdominal process. We'll also check for the flu as well as chest x-ray to evaluate for any signs of pneumonia or diaphragmatic effusion that could be causing irritation referring to the epigastric area. G shows a left bundle the patient denies any chest pain. We'll check troponin. Patient's husband states that the left bundle is chronic for him several years ago.  The patient will be placed on continuous pulse oximetry and telemetry for monitoring.  Laboratory evaluation will be sent to evaluate for the above complaints.     Clinical Course as of Aug 24 2123  Sat Aug 23, 2016  2030 I discussed results of laboratory evaluation and CT imaging with the patient spend. We discussed the abnormal finding of the dilated pancreatic duct and indication for further evaluation. After further discussion with the husband he states that he would not want any intervention and does not feel that the test is clinically indicated as they would not want anything done at this time anyways.  He did however mention that the patient had a fall with head injury 2 weeks ago for which they did not seek medical care and states that he has been having more confusion. Based on this  report given her age will order CT imaging of the head to evaluate for subdural or IPH.  [PR]  2121 CT imaging also unremarkable. Patient able to have a steady gait. Repeat abdominal exam is nontender.  Further observation in the ER was offered however the  patient is otherwise asymptomatic and hemodynamically stable at this time. Etiology of her discomfort is elusive at this time but I do not feel that it reflects any acute emergent process. Patient stable for discharge back to memory care facility. Discussed need for follow-up with primary care physician regarding abnormal CT imaging and possible repeat labs. Significance of enlarged pancreas duct is uncertain. I would expect that her pain would be more severe, prolonged and associated with some form of biliary or pancreatic abnormality were that the etiology of her symptoms.  Prolonged discussion with patient's husband about these results and he agrees to the plan.  Have discussed with the patient and available family all diagnostics and treatments performed thus far and all questions were answered to the best of my ability. The patient demonstrates understanding and agreement with plan.   [PR]    Clinical Course User Index [PR] Willy Eddy, MD     ____________________________________________   FINAL CLINICAL IMPRESSION(S) / ED DIAGNOSES  Final diagnoses:  Generalized abdominal pain  Abnormal CT of the abdomen      NEW MEDICATIONS STARTED DURING THIS VISIT:  New Prescriptions   No medications on file     Note:  This document was prepared using Dragon voice recognition software and may include unintentional dictation errors.    Willy Eddy, MD 08/23/16 4098    Willy Eddy, MD 08/23/16 2125

## 2016-08-23 NOTE — ED Triage Notes (Signed)
Pt presents to ED 01 from [facility] c/o abdominal pain in the epigastric area that pt describes as burning started earlier today; pt states pain at 5/10; pt is alert to herself, time, and location; pt is able to speak in complete sentences, does not appear to be in acute distress at this time; per EMS pt's vital signs were 182/82, HR 80, CBG 135; pt does not complain of nausea, vomiting, or diarrhea; pt has no cardiac history; pt has had cataracts removed bilaterally, and has glycoma bilaterally; at this time pt appears not to be in distress, is able to answer questions appropriately, has family at the bedside.

## 2016-08-25 ENCOUNTER — Other Ambulatory Visit: Payer: Self-pay | Admitting: Gerontology

## 2016-08-25 DIAGNOSIS — R109 Unspecified abdominal pain: Secondary | ICD-10-CM

## 2016-08-28 ENCOUNTER — Emergency Department
Admission: EM | Admit: 2016-08-28 | Discharge: 2016-08-28 | Disposition: A | Discharge status: Home / Self Care | Payer: Medicare HMO | Attending: Emergency Medicine | Admitting: Emergency Medicine

## 2016-08-28 DIAGNOSIS — Z79899 Other long term (current) drug therapy: Secondary | ICD-10-CM | POA: Insufficient documentation

## 2016-08-28 DIAGNOSIS — I10 Essential (primary) hypertension: Secondary | ICD-10-CM | POA: Diagnosis present

## 2016-08-28 LAB — CBC WITH DIFFERENTIAL/PLATELET
BASOS PCT: 1 %
Basophils Absolute: 0.1 10*3/uL (ref 0–0.1)
EOS ABS: 0.1 10*3/uL (ref 0–0.7)
EOS PCT: 1 %
HCT: 35.5 % (ref 35.0–47.0)
Hemoglobin: 11.9 g/dL — ABNORMAL LOW (ref 12.0–16.0)
Lymphocytes Relative: 40 %
Lymphs Abs: 2.8 10*3/uL (ref 1.0–3.6)
MCH: 30.8 pg (ref 26.0–34.0)
MCHC: 33.4 g/dL (ref 32.0–36.0)
MCV: 92.2 fL (ref 80.0–100.0)
MONO ABS: 0.7 10*3/uL (ref 0.2–0.9)
Monocytes Relative: 10 %
Neutro Abs: 3.4 10*3/uL (ref 1.4–6.5)
Neutrophils Relative %: 48 %
PLATELETS: 214 10*3/uL (ref 150–440)
RBC: 3.85 MIL/uL (ref 3.80–5.20)
RDW: 14.3 % (ref 11.5–14.5)
WBC: 7.1 10*3/uL (ref 3.6–11.0)

## 2016-08-28 LAB — LIPASE, BLOOD: LIPASE: 19 U/L (ref 11–51)

## 2016-08-28 LAB — COMPREHENSIVE METABOLIC PANEL
ALK PHOS: 65 U/L (ref 38–126)
ALT: 17 U/L (ref 14–54)
AST: 26 U/L (ref 15–41)
Albumin: 3.7 g/dL (ref 3.5–5.0)
Anion gap: 8 (ref 5–15)
BILIRUBIN TOTAL: 0.8 mg/dL (ref 0.3–1.2)
BUN: 9 mg/dL (ref 6–20)
CALCIUM: 8.9 mg/dL (ref 8.9–10.3)
CO2: 27 mmol/L (ref 22–32)
Chloride: 104 mmol/L (ref 101–111)
Creatinine, Ser: 0.78 mg/dL (ref 0.44–1.00)
GFR calc Af Amer: >60 mL/min (ref >60)
GFR calc non Af Amer: >60 mL/min (ref >60)
GLUCOSE: 97 mg/dL (ref 65–99)
Potassium: 3.8 mmol/L (ref 3.5–5.1)
Sodium: 139 mmol/L (ref 135–145)
TOTAL PROTEIN: 6.8 g/dL (ref 6.5–8.1)

## 2016-08-28 LAB — TROPONIN I: Troponin I: <0.03 ng/mL (ref <0.03)

## 2016-08-28 MED ORDER — HYDRALAZINE HCL 10 MG PO TABS
10.0000 mg | ORAL_TABLET | Freq: Once | ORAL | Status: AC
  Administered 2016-08-28: 10 mg via ORAL
  Filled 2016-08-28 (×2): qty 1

## 2016-08-28 MED ORDER — HYDRALAZINE HCL 10 MG PO TABS: 10.0000 mg | ORAL_TABLET | Freq: Three times a day (TID) | ORAL | 0 refills | Status: AC | PRN

## 2016-08-28 NOTE — ED Provider Notes (Addendum)
[facility] Emergency Department Provider Note ____________________________________________   I have reviewed the triage vital signs and the triage nursing note.  HISTORY  Chief Complaint Hypertension   Historian LImited history from patient, history of dementia EMS report from nursing facility, elevated blood pressure this evening Husband arrives, retired physician, provides most of the history and baseline   HPI Linda Elliott is a 83 y.o. female who is from the dementia unit, presents with a history of elevated blood pressures which were reported from EMS from the nursing home to be 170/160. With EMS reportedly 190/100, and upon ED arrival slightly lower than that. Husband arrives and was able to say that around 4 PM the patient was complaining of some abdominal pain which she does complain of from time to time, and that her blood pressures were a little bit elevated. She does not have high blood pressure. She was evaluated in emergency department recently and had some sort of abnormality on her pancreas for which she is scheduled for an outpatient MRI.  She has not been vomiting or had diarrhea. Currently she is having no chest pain or trouble breathing. She is denying any abdominal pain at this point in time no headache. No altered mental status to baseline. No focal weakness or numbness or confusion other than her baseline dementia.    Past Medical History:  Diagnosis Date  . Diverticulitis   . UTI (urinary tract infection)     Patient Active Problem List   Diagnosis Date Noted  . Cataract, nuclear sclerotic senile 09/22/2012  . Pseudoaphakia 09/14/2012  . Age-related cognitive decline 09/03/2012  . DD (diverticular disease) 09/03/2012  . BP (high blood pressure) 09/03/2012  . Herpes zona 09/03/2012  . Nuclear sclerotic cataract 04/13/2012  . Primary open angle glaucoma 04/13/2012    Past Surgical History:  Procedure Laterality Date  . EYE SURGERY       Prior to Admission medications   Medication Sig Start Date End Date Taking? Authorizing Provider  bimatoprost (LUMIGAN) 0.01 % SOLN Place 1 drop into the left eye at bedtime.    Historical Provider, MD  brimonidine (ALPHAGAN) 0.2 % ophthalmic solution Place into the left eye 2 (two) times daily.    Historical Provider, MD  donepezil (ARICEPT) 5 MG tablet Take 5 mg by mouth at bedtime.    Historical Provider, MD  dorzolamide-timolol (COSOPT) 22.3-6.8 MG/ML ophthalmic solution Place 1 drop into the left eye 2 (two) times daily.    Historical Provider, MD  lamoTRIgine (LAMICTAL) 25 MG tablet Take 50 mg by mouth 2 (two) times daily.    Historical Provider, MD  LORazepam (ATIVAN) 0.5 MG tablet Take 0.5 mg by mouth daily. 1700    Historical Provider, MD  LORazepam (ATIVAN) 0.5 MG tablet Take 0.5 mg by mouth 2 (two) times daily as needed for anxiety.    Historical Provider, MD  traZODone (DESYREL) 50 MG tablet Take 50 mg by mouth at bedtime.    Historical Provider, MD  vitamin B-12 (CYANOCOBALAMIN) 1000 MCG tablet Take 1,000 mcg by mouth daily.    Historical Provider, MD    Allergies  Allergen Reactions  . Penicillins Anaphylaxis    All cillins    No family history on file.  Social History Social History  Substance Use Topics  . Smoking status: Never Smoker  . Smokeless tobacco: Never Used  . Alcohol use No    Review of Systems  Constitutional: Negative for fever. Eyes: Negative for visual changes. ENT: Negative  for sore throat. Cardiovascular: Negative for chest pain. Respiratory: Negative for shortness of breath. Gastrointestinal: Apparently complained of abdominal pain earlier this afternoon, but no pain now Genitourinary: Negative for dysuria. Musculoskeletal: Negative for back pain. Skin: Negative for rash. Neurological: Negative for headache. 10 point Review of Systems otherwise negative ____________________________________________   PHYSICAL EXAM:  VITAL  SIGNS: ED Triage Vitals  Enc Vitals Group     BP 08/28/16 1857 (!) 182/68     Pulse Rate 08/28/16 1857 68     Resp 08/28/16 1857 16     Temp 08/28/16 1857 98.3 F (36.8 C)     Temp Source 08/28/16 1857 Oral     SpO2 08/28/16 1857 98 %     Weight 08/28/16 1859 165 lb (74.8 kg)     Height 08/28/16 1859 5\' 4"  (1.626 m)     Head Circumference --      Peak Flow --      Pain Score 08/28/16 1859 2     Pain Loc --      Pain Edu? --      Excl. in GC? --      Constitutional: Alert and Interactive, disoriented to place and time or why she is here. Well appearing and in no distress. HEENT   Head: Normocephalic and atraumatic.      Eyes: Conjunctivae are normal. PERRL. Normal extraocular movements.      Ears:         Nose: No congestion/rhinnorhea.   Mouth/Throat: Mucous membranes are moist.   Neck: No stridor. Cardiovascular/Chest: Normal rate, regular rhythm.  No murmurs, rubs, or gallops. Respiratory: Normal respiratory effort without tachypnea nor retractions. Breath sounds are clear and equal bilaterally. No wheezes/rales/rhonchi. Gastrointestinal: Soft. No distention, no guarding, no rebound. Nontender to deep and superficial palpation in 4 quadrants.  Genitourinary/rectal:Deferred Musculoskeletal: Nontender with normal range of motion in all extremities. No joint effusions.  No lower extremity tenderness.  No edema. Neurologic:  No facial droop. No slurred speech. No gross or focal neurologic deficits are appreciated. Skin:  Skin is warm, dry and intact. No rash noted. Psychiatric: No agitation.   ____________________________________________  LABS (pertinent positives/negatives)  Labs Reviewed  CBC WITH DIFFERENTIAL/PLATELET - Abnormal; Notable for the following:       Result Value   Hemoglobin 11.9 (*)    All other components within normal limits  COMPREHENSIVE METABOLIC PANEL  TROPONIN I  LIPASE, BLOOD  URINALYSIS, COMPLETE (UACMP) WITH MICROSCOPIC     ____________________________________________    EKG I, Governor Rooks, MD, the attending physician have personally viewed and interpreted all ECGs.  65 bpm. Normal sinus rhythm with first degree AV block. Nonspecific intraventricular conduction delay. Normal axis. Nonspecific ST and T-wave ____________________________________________  RADIOLOGY All Xrays were viewed by me. Imaging interpreted by Radiologist.  None __________________________________________  PROCEDURES  Procedure(s) performed: None  Critical Care performed: None  ____________________________________________   ED COURSE / ASSESSMENT AND PLAN  Pertinent labs & imaging results that were available during my care of the patient were reviewed by me and considered in my medical decision making (see chart for details).   Ms Monger reportedly was having abdominal pain in the mid and umbilicus area earlier and she was found to have very elevated blood pressures.  Currently she is asymptomatic with no abdominal pain or pain otherwise, and blood pressure is down to 183/60.  Her husband is here the bedside, and we discussed given the recent CT scan showing potential blockage of the duct  of the pancreas, to obtain lipase and laboratory studies as a screening given her symptoms earlier today although essentially resolved at this point.   Blood pressures are elevated here, uncertain etiology, but recently was elevated as well. Husband who is a physician states that she often has normal blood pressures, so I'm somewhat hesitant to start her on daily blood pressure medication. However I will give her one dose of hydralazine here. She can discuss with the primary care doctor whether or not she should be tried on a blood pressure medication.  Lab for studies do not indicate any new acute signs for pancreatitis. Again abdomen is nontender to think she needs any additional imaging now. Okay for discharge from the emergency department  tonight.   I did discuss with the husband a trial of when necessary hydralazine.  CONSULTATIONS:   None   Patient / Family / Caregiver informed of clinical course, medical decision-making process, and agree with plan.   I discussed return precautions, follow-up instructions, and discharge instructions with patient and/or family.   ___________________________________________   FINAL CLINICAL IMPRESSION(S) / ED DIAGNOSES   Final diagnoses:  Hypertension, unspecified type              Note: This dictation was prepared with Dragon dictation. Any transcriptional errors that result from this process are unintentional    Governor Rooks, MD 08/28/16 2139    Governor Rooks, MD 08/28/16 2209

## 2016-08-28 NOTE — ED Notes (Signed)
AAOx3.  Skin warm and dry.  NAD 

## 2016-08-28 NOTE — Discharge Instructions (Signed)
You were evaluated for elevated blood pressure today, and abdominal pain earlier today, and no certain cause was found, but your exam and evaluation are reassuring in the emergency department today.  Return to the emergency room immediately for any worsening abdominal pain or chest pain, confusion altered mental status, blood pressure greater than 220/120, or any other symptoms concerning to you.

## 2016-08-28 NOTE — ED Triage Notes (Signed)
[facility] with hypertension. Facility reports pt's BP was 170/160, EMS reports pt's BP 197/100. Pt alert and oriented, reports slight headache.

## 2016-09-04 ENCOUNTER — Encounter
Admission: RE | Admit: 2016-09-04 | Discharge: 2016-09-04 | Disposition: A | Discharge status: Home / Self Care | Payer: Medicare HMO | Source: Ambulatory Visit | Attending: Internal Medicine | Admitting: Internal Medicine

## 2016-09-05 ENCOUNTER — Other Ambulatory Visit: Payer: Self-pay | Admitting: Gerontology

## 2016-09-05 ENCOUNTER — Ambulatory Visit: Payer: Medicare HMO

## 2016-09-05 DIAGNOSIS — R1013 Epigastric pain: Secondary | ICD-10-CM

## 2016-09-05 DIAGNOSIS — R1084 Generalized abdominal pain: Secondary | ICD-10-CM

## 2016-09-06 ENCOUNTER — Ambulatory Visit (HOSPITAL_COMMUNITY)
Admission: RE | Admit: 2016-09-06 | Discharge: 2016-09-06 | Disposition: A | Discharge status: Home / Self Care | Payer: Medicare HMO | Source: Ambulatory Visit | Attending: Gerontology | Admitting: Gerontology

## 2016-09-06 DIAGNOSIS — K8689 Other specified diseases of pancreas: Secondary | ICD-10-CM | POA: Insufficient documentation

## 2016-09-06 DIAGNOSIS — R1013 Epigastric pain: Secondary | ICD-10-CM | POA: Diagnosis present

## 2016-09-06 MED ORDER — GADOBENATE DIMEGLUMINE 529 MG/ML IV SOLN
15.0000 mL | Freq: Once | INTRAVENOUS | Status: AC | PRN
  Administered 2016-09-06: 15 mL via INTRAVENOUS

## 2016-09-17 ENCOUNTER — Ambulatory Visit: Payer: Medicare HMO

## 2016-09-18 ENCOUNTER — Emergency Department: Payer: Medicare HMO

## 2016-09-18 ENCOUNTER — Encounter: Payer: Self-pay | Admitting: *Deleted

## 2016-09-18 ENCOUNTER — Encounter: Payer: Self-pay | Admitting: Gerontology

## 2016-09-18 ENCOUNTER — Observation Stay
Admission: EM | Admit: 2016-09-18 | Discharge: 2016-09-18 | Disposition: A | Discharge status: Skilled Nursing Facility | Payer: Medicare HMO | Attending: Internal Medicine | Admitting: Internal Medicine

## 2016-09-18 DIAGNOSIS — R778 Other specified abnormalities of plasma proteins: Secondary | ICD-10-CM | POA: Insufficient documentation

## 2016-09-18 DIAGNOSIS — S098XXA Other specified injuries of head, initial encounter: Secondary | ICD-10-CM | POA: Insufficient documentation

## 2016-09-18 DIAGNOSIS — H409 Unspecified glaucoma: Secondary | ICD-10-CM | POA: Diagnosis not present

## 2016-09-18 DIAGNOSIS — S50311A Abrasion of right elbow, initial encounter: Secondary | ICD-10-CM | POA: Diagnosis not present

## 2016-09-18 DIAGNOSIS — W19XXXA Unspecified fall, initial encounter: Secondary | ICD-10-CM

## 2016-09-18 DIAGNOSIS — E119 Type 2 diabetes mellitus without complications: Secondary | ICD-10-CM | POA: Insufficient documentation

## 2016-09-18 DIAGNOSIS — Z79899 Other long term (current) drug therapy: Secondary | ICD-10-CM | POA: Insufficient documentation

## 2016-09-18 DIAGNOSIS — F0390 Unspecified dementia without behavioral disturbance: Secondary | ICD-10-CM | POA: Diagnosis not present

## 2016-09-18 DIAGNOSIS — Z66 Do not resuscitate: Secondary | ICD-10-CM | POA: Diagnosis not present

## 2016-09-18 DIAGNOSIS — W01190A Fall on same level from slipping, tripping and stumbling with subsequent striking against furniture, initial encounter: Secondary | ICD-10-CM | POA: Diagnosis not present

## 2016-09-18 DIAGNOSIS — I440 Atrioventricular block, first degree: Secondary | ICD-10-CM | POA: Insufficient documentation

## 2016-09-18 DIAGNOSIS — R55 Syncope and collapse: Secondary | ICD-10-CM | POA: Insufficient documentation

## 2016-09-18 DIAGNOSIS — R7989 Other specified abnormal findings of blood chemistry: Secondary | ICD-10-CM

## 2016-09-18 HISTORY — DX: Unspecified dementia, unspecified severity, without behavioral disturbance, psychotic disturbance, mood disturbance, and anxiety: F03.90

## 2016-09-18 HISTORY — DX: Unspecified glaucoma: H40.9

## 2016-09-18 LAB — CBC
HEMATOCRIT: 37.7 % (ref 35.0–47.0)
HEMOGLOBIN: 12.7 g/dL (ref 12.0–16.0)
MCH: 31.2 pg (ref 26.0–34.0)
MCHC: 33.6 g/dL (ref 32.0–36.0)
MCV: 92.7 fL (ref 80.0–100.0)
Platelets: 191 10*3/uL (ref 150–440)
RBC: 4.07 MIL/uL (ref 3.80–5.20)
RDW: 13.8 % (ref 11.5–14.5)
WBC: 6.9 10*3/uL (ref 3.6–11.0)

## 2016-09-18 LAB — BASIC METABOLIC PANEL
ANION GAP: 7 (ref 5–15)
ANION GAP: 7 (ref 5–15)
BUN: 13 mg/dL (ref 6–20)
BUN: 11 mg/dL (ref 6–20)
CALCIUM: 8.7 mg/dL — AB (ref 8.9–10.3)
CHLORIDE: 105 mmol/L (ref 101–111)
CO2: 28 mmol/L (ref 22–32)
CO2: 28 mmol/L (ref 22–32)
Calcium: 8.9 mg/dL (ref 8.9–10.3)
Chloride: 102 mmol/L (ref 101–111)
Creatinine, Ser: 0.74 mg/dL (ref 0.44–1.00)
Creatinine, Ser: 0.54 mg/dL (ref 0.44–1.00)
GFR calc Af Amer: >60 mL/min (ref >60)
GFR calc non Af Amer: >60 mL/min (ref >60)
GFR calc non Af Amer: >60 mL/min (ref >60)
GLUCOSE: 109 mg/dL — AB (ref 65–99)
GLUCOSE: 105 mg/dL — AB (ref 65–99)
POTASSIUM: 3.8 mmol/L (ref 3.5–5.1)
Potassium: 3.7 mmol/L (ref 3.5–5.1)
SODIUM: 137 mmol/L (ref 135–145)
Sodium: 140 mmol/L (ref 135–145)

## 2016-09-18 LAB — TROPONIN I
Troponin I: 0.03 ng/mL (ref <0.03)
Troponin I: <0.03 ng/mL (ref <0.03)

## 2016-09-18 LAB — CBC WITH DIFFERENTIAL/PLATELET
BASOS ABS: 0 10*3/uL (ref 0–0.1)
BASOS PCT: 1 %
EOS ABS: 0.2 10*3/uL (ref 0–0.7)
Eosinophils Relative: 2 %
HCT: 35.2 % (ref 35.0–47.0)
HEMOGLOBIN: 11.6 g/dL — AB (ref 12.0–16.0)
Lymphocytes Relative: 31 %
Lymphs Abs: 2.3 10*3/uL (ref 1.0–3.6)
MCH: 30.1 pg (ref 26.0–34.0)
MCHC: 33.1 g/dL (ref 32.0–36.0)
MCV: 91.1 fL (ref 80.0–100.0)
Monocytes Absolute: 0.9 10*3/uL (ref 0.2–0.9)
Monocytes Relative: 12 %
NEUTROS ABS: 4.2 10*3/uL (ref 1.4–6.5)
Neutrophils Relative %: 54 %
PLATELETS: 188 10*3/uL (ref 150–440)
RBC: 3.87 MIL/uL (ref 3.80–5.20)
RDW: 13.9 % (ref 11.5–14.5)
WBC: 7.5 10*3/uL (ref 3.6–11.0)

## 2016-09-18 LAB — URINALYSIS, COMPLETE (UACMP) WITH MICROSCOPIC
Bacteria, UA: NONE SEEN
Bilirubin Urine: NEGATIVE
GLUCOSE, UA: NEGATIVE mg/dL
Hgb urine dipstick: NEGATIVE
Ketones, ur: 5 mg/dL — AB
Leukocytes, UA: NEGATIVE
Nitrite: NEGATIVE
PH: 5 (ref 5.0–8.0)
PROTEIN: NEGATIVE mg/dL
Specific Gravity, Urine: 1.017 (ref 1.005–1.030)

## 2016-09-18 LAB — HEPATIC FUNCTION PANEL
ALT: 13 U/L — ABNORMAL LOW (ref 14–54)
AST: 21 U/L (ref 15–41)
Albumin: 3.8 g/dL (ref 3.5–5.0)
Alkaline Phosphatase: 72 U/L (ref 38–126)
BILIRUBIN DIRECT: 0.1 mg/dL (ref 0.1–0.5)
BILIRUBIN INDIRECT: 0.4 mg/dL (ref 0.3–0.9)
TOTAL PROTEIN: 6.5 g/dL (ref 6.5–8.1)
Total Bilirubin: 0.5 mg/dL (ref 0.3–1.2)

## 2016-09-18 LAB — MRSA PCR SCREENING: MRSA by PCR: NEGATIVE

## 2016-09-18 MED ORDER — LORAZEPAM 0.5 MG PO TABS: 0.5000 mg | ORAL_TABLET | Freq: Two times a day (BID) | ORAL | Status: DC | PRN

## 2016-09-18 MED ORDER — ACETAMINOPHEN 325 MG PO TABS: 650.0000 mg | ORAL_TABLET | Freq: Four times a day (QID) | ORAL | Status: DC | PRN

## 2016-09-18 MED ORDER — TRAZODONE HCL 50 MG PO TABS: 50.0000 mg | ORAL_TABLET | Freq: Every day | ORAL | Status: DC

## 2016-09-18 MED ORDER — SODIUM CHLORIDE 0.9% FLUSH
3.0000 mL | Freq: Two times a day (BID) | INTRAVENOUS | Status: DC
Start: 2016-09-18 — End: 2016-09-18

## 2016-09-18 MED ORDER — LATANOPROST 0.005 % OP SOLN
1.0000 [drp] | Freq: Every day | OPHTHALMIC | Status: DC
  Filled 2016-09-18: qty 2.5

## 2016-09-18 MED ORDER — ONDANSETRON HCL 4 MG PO TABS: 4.0000 mg | ORAL_TABLET | Freq: Four times a day (QID) | ORAL | Status: DC | PRN

## 2016-09-18 MED ORDER — LAMOTRIGINE 25 MG PO TABS
50.0000 mg | ORAL_TABLET | Freq: Two times a day (BID) | ORAL | Status: DC
  Administered 2016-09-18: 50 mg via ORAL
  Filled 2016-09-18: qty 2

## 2016-09-18 MED ORDER — ONDANSETRON HCL 4 MG/2ML IJ SOLN
4.0000 mg | Freq: Four times a day (QID) | INTRAMUSCULAR | Status: DC | PRN
Start: 2016-09-18 — End: 2016-09-18

## 2016-09-18 MED ORDER — DONEPEZIL HCL 5 MG PO TABS: 5.0000 mg | ORAL_TABLET | Freq: Every day | ORAL | Status: DC

## 2016-09-18 MED ORDER — LORAZEPAM 2 MG/ML IJ SOLN
INTRAMUSCULAR | Status: AC
  Filled 2016-09-18: qty 1

## 2016-09-18 MED ORDER — DORZOLAMIDE HCL-TIMOLOL MAL PF 22.3-6.8 MG/ML OP SOLN
1.0000 [drp] | Freq: Two times a day (BID) | OPHTHALMIC | Status: DC
  Administered 2016-09-18: 1 [drp] via OPHTHALMIC
  Filled 2016-09-18 (×3): qty 0.1

## 2016-09-18 MED ORDER — DORZOLAMIDE HCL 2 % OP SOLN
1.0000 [drp] | Freq: Two times a day (BID) | OPHTHALMIC | Status: DC
  Filled 2016-09-18: qty 10

## 2016-09-18 MED ORDER — LORAZEPAM 2 MG/ML IJ SOLN: 0.5000 mg | Freq: Once | INTRAMUSCULAR | Status: DC

## 2016-09-18 MED ORDER — HYDRALAZINE HCL 10 MG PO TABS
10.0000 mg | ORAL_TABLET | Freq: Three times a day (TID) | ORAL | Status: DC
  Administered 2016-09-18: 10 mg via ORAL
  Filled 2016-09-18: qty 1

## 2016-09-18 MED ORDER — VITAMIN B-12 1000 MCG PO TABS
1000.0000 ug | ORAL_TABLET | Freq: Every day | ORAL | Status: DC
  Administered 2016-09-18: 1000 ug via ORAL
  Filled 2016-09-18 (×2): qty 1

## 2016-09-18 MED ORDER — ACETAMINOPHEN 650 MG RE SUPP: 650.0000 mg | Freq: Four times a day (QID) | RECTAL | Status: DC | PRN

## 2016-09-18 MED ORDER — SODIUM CHLORIDE 0.9 % IV SOLN
INTRAVENOUS | Status: DC
  Administered 2016-09-18: 05:00:00 via INTRAVENOUS

## 2016-09-18 MED ORDER — SENNOSIDES-DOCUSATE SODIUM 8.6-50 MG PO TABS: 1.0000 | ORAL_TABLET | Freq: Every evening | ORAL | Status: DC | PRN

## 2016-09-18 MED ORDER — DORZOLAMIDE HCL-TIMOLOL MAL 2-0.5 % OP SOLN
1.0000 [drp] | Freq: Two times a day (BID) | OPHTHALMIC | Status: DC
  Filled 2016-09-18: qty 10

## 2016-09-18 MED ORDER — BRIMONIDINE TARTRATE 0.2 % OP SOLN
1.0000 [drp] | Freq: Three times a day (TID) | OPHTHALMIC | Status: DC
  Filled 2016-09-18: qty 5

## 2016-09-18 MED ORDER — LORAZEPAM 2 MG/ML IJ SOLN
0.5000 mg | Freq: Once | INTRAMUSCULAR | Status: AC
  Administered 2016-09-18: 0.5 mg via INTRAVENOUS

## 2016-09-18 NOTE — Progress Notes (Signed)
Pt has removed telemetry monitor 2 times. Pt states "she doesn't want that on her skin". MD Sudini notified. Verbal order received to d/c tele monitor. Order placed.

## 2016-09-18 NOTE — Progress Notes (Signed)
Verbal order to discharge [facility] back [facility] received by MD Sudini. Order placed.

## 2016-09-18 NOTE — ED Notes (Signed)
Report off to david rn 

## 2016-09-18 NOTE — Progress Notes (Addendum)
Clinical Education officer, museum (CSW) met with patient's husband Delfino Lovett Moren at bedside and asked if he wanted to discuss his concerns with attending MD about patient's hallucinations and confusion, and the MD would possibly cancel the D/C. Husband reported that patient is not normally this confused but he feels comfortable for her to D/C and stated that [facility] can manage her needs. RN paged MD and made him aware of above. MD continued D/C order. CSW contacted Bari Mantis nurse manager at the memory care unit at [facility] and made her aware patient is coming today. Bari Mantis reported that patient's confusion is not new and she wanders at night and is constantly looking for her husband and her purse. Bari Mantis reported that she cornered one of their nurses and pounded her fist to demand something. Bari Mantis reported that this is patient's baseline. Per Bari Mantis once patient gets back to [facility] memory care they will assess her to see if patient needs to be moved to the SNF side of [employer] Patient's husband is aware of above. Please reconsult if future social work needs arise. CSW signing off.   [employer], LCSW [phone]

## 2016-09-18 NOTE — Progress Notes (Signed)
[facility] called to [facility].

## 2016-09-18 NOTE — Progress Notes (Signed)
Linda Elliott is a 83 y.o. female  [id number]  Primary Cardiologist: Adrian Blackwater Reason for Consultation: Syncope  HPI: This is a 83 year old white female with a history of dementia glaucoma and diabetes urinary tract infection in the past presented to the hospital after falling down and possible blackout. Patient denies any chest pain or shortness of breath.   Review of Systems: No orthopnea PND or leg swelling   Past Medical History:  Diagnosis Date  . Dementia   . Diverticulitis   . Glaucoma   . UTI (urinary tract infection)     Medications Prior to Admission  Medication Sig Dispense Refill  . bimatoprost (LUMIGAN) 0.01 % SOLN Place 1 drop into the left eye at bedtime.    . brimonidine (ALPHAGAN) 0.2 % ophthalmic solution Place into the left eye 2 (two) times daily.    Marland Kitchen donepezil (ARICEPT) 5 MG tablet Take 5 mg by mouth at bedtime.    . dorzolamide-timolol (COSOPT) 22.3-6.8 MG/ML ophthalmic solution Place 1 drop into the left eye 2 (two) times daily.    . hydrALAZINE (APRESOLINE) 10 MG tablet Take 1 tablet (10 mg total) by mouth 3 (three) times daily as needed (prn blood pressure greater than 180/90). 10 tablet 0  . lamoTRIgine (LAMICTAL) 25 MG tablet Take 50 mg by mouth 2 (two) times daily.    Marland Kitchen LORazepam (ATIVAN) 0.5 MG tablet Take 0.5 mg by mouth daily. 1700    . LORazepam (ATIVAN) 0.5 MG tablet Take 0.5 mg by mouth 2 (two) times daily as needed for anxiety.    . traZODone (DESYREL) 50 MG tablet Take 50 mg by mouth at bedtime.    . vitamin B-12 (CYANOCOBALAMIN) 1000 MCG tablet Take 1,000 mcg by mouth daily.       . brimonidine  1 drop Both Eyes Q8H  . donepezil  5 mg Oral QHS  . dorzolamidel-timolol  1 drop Left Eye BID  . lamoTRIgine  50 mg Oral BID  . latanoprost  1 drop Left Eye QHS  . sodium chloride flush  3 mL Intravenous Q12H  . traZODone  50 mg Oral QHS  . vitamin B-12  1,000 mcg Oral Daily    Infusions: . sodium chloride 50 mL/hr at 09/18/16 0450     Allergies  Allergen Reactions  . Penicillins Anaphylaxis    All cillins Has patient had a PCN reaction causing immediate rash, facial/tongue/throat swelling, SOB or lightheadedness with hypotension: Yes Has patient had a PCN reaction causing severe rash involving mucus membranes or skin necrosis: No Has patient had a PCN reaction that required hospitalization Yes Has patient had a PCN reaction occurring within the last 10 years: No If all of the above answers are "NO", then may proceed with Cephalosporin use.     Social History   Social History  . Marital status: Married    Spouse name: N/A  . Number of children: N/A  . Years of education: N/A   Occupational History  . retired    Social History Main Topics  . Smoking status: Never Smoker  . Smokeless tobacco: Never Used  . Alcohol use No  . Drug use: No  . Sexual activity: Not on file   Other Topics Concern  . Not on file   Social History Narrative  . No narrative on file    Family History  Problem Relation Age of Onset  . CAD Mother     PHYSICAL EXAM: Vitals:   09/18/16 4098 09/18/16 1191  BP: (!) 164/86 (!) 168/82  Pulse: 74 72  Resp: 18 18  Temp: 98.6 F (37 C) 98.2 F (36.8 C)     Intake/Output Summary (Last 24 hours) at 09/18/16 0858 Last data filed at 09/18/16 4540  Gross per 24 hour  Intake                0 ml  Output              350 ml  Net             -350 ml    General:  Well appearing. No respiratory difficulty HEENT: normal Neck: supple. no JVD. Carotids 2+ bilat; no bruits. No lymphadenopathy or thryomegaly appreciated. Cor: PMI nondisplaced. Regular rate & rhythm. No rubs, gallops or murmurs. Lungs: clear Abdomen: soft, nontender, nondistended. No hepatosplenomegaly. No bruits or masses. Good bowel sounds. Extremities: no cyanosis, clubbing, rash, edema Neuro: alert & oriented x 3, cranial nerves grossly intact. moves all 4 extremities w/o difficulty. Affect  pleasant.  JWJ:XBJYN bradycardia with first-degree A-V block nonspecific ST-T changes  Results for orders placed or performed during the hospital encounter of 09/18/16 (from the past 24 hour(s))  Basic metabolic panel     Status: Abnormal   Collection Time: 09/18/16  1:03 AM  Result Value Ref Range   Sodium 137 135 - 145 mmol/L   Potassium 3.8 3.5 - 5.1 mmol/L   Chloride 102 101 - 111 mmol/L   CO2 28 22 - 32 mmol/L   Glucose, Bld 109 (H) 65 - 99 mg/dL   BUN 13 6 - 20 mg/dL   Creatinine, Ser 8.29 0.44 - 1.00 mg/dL   Calcium 8.7 (L) 8.9 - 10.3 mg/dL   GFR calc non Af Amer >60 >60 mL/min   GFR calc Af Amer >60 >60 mL/min   Anion gap 7 5 - 15  Hepatic function panel     Status: Abnormal   Collection Time: 09/18/16  1:03 AM  Result Value Ref Range   Total Protein 6.5 6.5 - 8.1 g/dL   Albumin 3.8 3.5 - 5.0 g/dL   AST 21 15 - 41 U/L   ALT 13 (L) 14 - 54 U/L   Alkaline Phosphatase 72 38 - 126 U/L   Total Bilirubin 0.5 0.3 - 1.2 mg/dL   Bilirubin, Direct 0.1 0.1 - 0.5 mg/dL   Indirect Bilirubin 0.4 0.3 - 0.9 mg/dL  Troponin I     Status: Abnormal   Collection Time: 09/18/16  1:03 AM  Result Value Ref Range   Troponin I 0.03 (HH) <0.03 ng/mL  CBC with Differential     Status: Abnormal   Collection Time: 09/18/16  1:03 AM  Result Value Ref Range   WBC 7.5 3.6 - 11.0 K/uL   RBC 3.87 3.80 - 5.20 MIL/uL   Hemoglobin 11.6 (L) 12.0 - 16.0 g/dL   HCT 56.2 13.0 - 86.5 %   MCV 91.1 80.0 - 100.0 fL   MCH 30.1 26.0 - 34.0 pg   MCHC 33.1 32.0 - 36.0 g/dL   RDW 78.4 69.6 - 29.5 %   Platelets 188 150 - 440 K/uL   Neutrophils Relative % 54 %   Neutro Abs 4.2 1.4 - 6.5 K/uL   Lymphocytes Relative 31 %   Lymphs Abs 2.3 1.0 - 3.6 K/uL   Monocytes Relative 12 %   Monocytes Absolute 0.9 0.2 - 0.9 K/uL   Eosinophils Relative 2 %   Eosinophils Absolute 0.2 0 -  0.7 K/uL   Basophils Relative 1 %   Basophils Absolute 0.0 0 - 0.1 K/uL  Urinalysis, Complete w Microscopic     Status: Abnormal    Collection Time: 09/18/16  1:04 AM  Result Value Ref Range   Color, Urine YELLOW (A) YELLOW   APPearance CLEAR (A) CLEAR   Specific Gravity, Urine 1.017 1.005 - 1.030   pH 5.0 5.0 - 8.0   Glucose, UA NEGATIVE NEGATIVE mg/dL   Hgb urine dipstick NEGATIVE NEGATIVE   Bilirubin Urine NEGATIVE NEGATIVE   Ketones, ur 5 (A) NEGATIVE mg/dL   Protein, ur NEGATIVE NEGATIVE mg/dL   Nitrite NEGATIVE NEGATIVE   Leukocytes, UA NEGATIVE NEGATIVE   RBC / HPF 0-5 0 - 5 RBC/hpf   WBC, UA 0-5 0 - 5 WBC/hpf   Bacteria, UA NONE SEEN NONE SEEN   Squamous Epithelial / LPF 0-5 (A) NONE SEEN   Mucous PRESENT   Troponin I     Status: None   Collection Time: 09/18/16  7:16 AM  Result Value Ref Range   Troponin I <0.03 <0.03 ng/mL  Basic metabolic panel     Status: Abnormal   Collection Time: 09/18/16  7:16 AM  Result Value Ref Range   Sodium 140 135 - 145 mmol/L   Potassium 3.7 3.5 - 5.1 mmol/L   Chloride 105 101 - 111 mmol/L   CO2 28 22 - 32 mmol/L   Glucose, Bld 105 (H) 65 - 99 mg/dL   BUN 11 6 - 20 mg/dL   Creatinine, Ser 5.78 0.44 - 1.00 mg/dL   Calcium 8.9 8.9 - 46.9 mg/dL   GFR calc non Af Amer >60 >60 mL/min   GFR calc Af Amer >60 >60 mL/min   Anion gap 7 5 - 15  CBC     Status: None   Collection Time: 09/18/16  7:16 AM  Result Value Ref Range   WBC 6.9 3.6 - 11.0 K/uL   RBC 4.07 3.80 - 5.20 MIL/uL   Hemoglobin 12.7 12.0 - 16.0 g/dL   HCT 62.9 52.8 - 41.3 %   MCV 92.7 80.0 - 100.0 fL   MCH 31.2 26.0 - 34.0 pg   MCHC 33.6 32.0 - 36.0 g/dL   RDW 24.4 01.0 - 27.2 %   Platelets 191 150 - 440 K/uL   Dg Elbow Complete Right  Result Date: 09/18/2016 CLINICAL DATA:  83 year old female with trauma to the right elbow. EXAM: RIGHT ELBOW - COMPLETE 3+ VIEW COMPARISON:  None. FINDINGS: There is no acute fracture or dislocation. The bones are osteopenic. A small exophytic bony density arising from the proximal radius likely an exostosis. There is no joint effusion. The soft tissues appear  unremarkable. IMPRESSION: No acute fracture or dislocation. Electronically Signed   By: Elgie Collard M.D.   On: 09/18/2016 02:12   Ct Head Wo Contrast  Result Date: 09/18/2016 CLINICAL DATA:  Trip and fall injury. Head struck hand table. No loss of consciousness. EXAM: CT HEAD WITHOUT CONTRAST CT CERVICAL SPINE WITHOUT CONTRAST TECHNIQUE: Multidetector CT imaging of the head and cervical spine was performed following the standard protocol without intravenous contrast. Multiplanar CT image reconstructions of the cervical spine were also generated. COMPARISON:  08/23/2016 CT head FINDINGS: CT HEAD FINDINGS Brain: Diffuse cerebral atrophy. Ventricular dilatation consistent with central atrophy. Low-attenuation changes in the deep white matter consistent with small vessel ischemia. No mass effect or midline shift. No abnormal extra-axial fluid collections. Gray-white matter junctions are distinct. Basal cisterns are  not effaced. No acute intracranial hemorrhage. Vascular: Vascular calcifications are present. Skull: Calvarium appears intact. Sinuses/Orbits: Paranasal sinuses and mastoid air cells are clear. Other: No significant changes since prior study. CT CERVICAL SPINE FINDINGS Alignment: Normal. Skull base and vertebrae: No acute fracture. No primary bone lesion or focal pathologic process. Soft tissues and spinal canal: No prevertebral fluid or swelling. No visible canal hematoma. Disc levels: Degenerative changes throughout the cervical spine with narrowed disc spaces and endplate hypertrophic changes. Subcortical cyst formation. Degenerative changes throughout the facet joints. Upper chest: Azygos lobe. Other: Vascular calcifications in the cervical carotid arteries. IMPRESSION: No acute intracranial abnormalities. Chronic atrophy and small vessel ischemic changes. Normal alignment of the cervical spine. Diffuse degenerative changes. No acute displaced fractures identified. Electronically Signed   By:  Burman NievesWilliam  Stevens M.D.   On: 09/18/2016 02:37   Ct Cervical Spine Wo Contrast  Result Date: 09/18/2016 CLINICAL DATA:  Trip and fall injury. Head struck hand table. No loss of consciousness. EXAM: CT HEAD WITHOUT CONTRAST CT CERVICAL SPINE WITHOUT CONTRAST TECHNIQUE: Multidetector CT imaging of the head and cervical spine was performed following the standard protocol without intravenous contrast. Multiplanar CT image reconstructions of the cervical spine were also generated. COMPARISON:  08/23/2016 CT head FINDINGS: CT HEAD FINDINGS Brain: Diffuse cerebral atrophy. Ventricular dilatation consistent with central atrophy. Low-attenuation changes in the deep white matter consistent with small vessel ischemia. No mass effect or midline shift. No abnormal extra-axial fluid collections. Gray-white matter junctions are distinct. Basal cisterns are not effaced. No acute intracranial hemorrhage. Vascular: Vascular calcifications are present. Skull: Calvarium appears intact. Sinuses/Orbits: Paranasal sinuses and mastoid air cells are clear. Other: No significant changes since prior study. CT CERVICAL SPINE FINDINGS Alignment: Normal. Skull base and vertebrae: No acute fracture. No primary bone lesion or focal pathologic process. Soft tissues and spinal canal: No prevertebral fluid or swelling. No visible canal hematoma. Disc levels: Degenerative changes throughout the cervical spine with narrowed disc spaces and endplate hypertrophic changes. Subcortical cyst formation. Degenerative changes throughout the facet joints. Upper chest: Azygos lobe. Other: Vascular calcifications in the cervical carotid arteries. IMPRESSION: No acute intracranial abnormalities. Chronic atrophy and small vessel ischemic changes. Normal alignment of the cervical spine. Diffuse degenerative changes. No acute displaced fractures identified. Electronically Signed   By: Burman NievesWilliam  Stevens M.D.   On: 09/18/2016 02:37   Dg Chest Port 1 View  Result  Date: 09/18/2016 CLINICAL DATA:  83 year old female with fall. EXAM: PORTABLE CHEST 1 VIEW COMPARISON:  Chest radiograph dated 08/23/2016 FINDINGS: The lungs are clear. There is no pleural effusion or pneumothorax. The cardiac silhouette is within normal limits with no acute osseous pathology identified. IMPRESSION: No active disease. Electronically Signed   By: Elgie CollardArash  Radparvar M.D.   On: 09/18/2016 02:08     ASSESSMENT AND PLAN: Syncopal episode with no acute EKG changes and cardiac enzymes are unremarkable will do echocardiogram to further evaluate ejection fraction or any other cause of syncope.  Ziyon Soltau A

## 2016-09-18 NOTE — [place] FL2 (Signed)
[place] MEDICAID FL2 LEVEL OF CARE SCREENING TOOL     IDENTIFICATION  Patient Name: Linda Elliott Birthdate: 26-May-1923 Sex: female Admission Date (Current Location): 09/18/2016  [facility] [place]   Facility and Address:  [facility], [address], [place], [place] [address]      Provider Number: [id number]  Attending Physician Name and Address:  Milagros LollSrikar Sudini, MD  Relative Name and Phone Number:       Current Level of Care: Hospital Recommended Level of Care: Assisted Living Facility  Prior Approval Number:    Date Approved/Denied:   PASRR Number:    Discharge Plan: ALF     Current Diagnoses: Patient Active Problem List   Diagnosis Date Noted  . Fall 09/18/2016  . Cataract, nuclear sclerotic senile 09/22/2012  . Pseudoaphakia 09/14/2012  . Age-related cognitive decline 09/03/2012  . DD (diverticular disease) 09/03/2012  . BP (high blood pressure) 09/03/2012  . Herpes zona 09/03/2012  . Nuclear sclerotic cataract 04/13/2012  . Primary open angle glaucoma 04/13/2012    Orientation RESPIRATION BLADDER Height & Weight     Self  Normal Continent Weight: 156 lb 12.8 oz (71.1 kg) Height:  5\' 4"  (162.6 cm)  BEHAVIORAL SYMPTOMS/MOOD NEUROLOGICAL BOWEL NUTRITION STATUS   (None. )  (None. ) Continent Diet (Diet: Heart Room )  AMBULATORY STATUS COMMUNICATION OF NEEDS Skin   Extensive Assist Verbally Normal                       Personal Care Assistance Level of Assistance  Bathing, Feeding, Dressing Bathing Assistance: Limited assistance Feeding assistance: Independent Dressing Assistance: Limited assistance     Functional Limitations Info  Sight, Hearing, Speech Sight Info: Adequate Hearing Info: Impaired Speech Info: Adequate    SPECIAL CARE FACTORS FREQUENCY  PT (By licensed PT), OT (By licensed OT)                  Contractures      Additional Factors Info  Code Status, Allergies Code  Status Info:  (DNR) Allergies Info:  (Penicillins)           Current Medications (09/18/2016):  This is the current hospital active medication list Current Facility-Administered Medications  Medication Dose Route Frequency Provider Last Rate Last Dose  . 0.9 %  sodium chloride infusion   Intravenous Continuous Ihor AustinPavan Pyreddy, MD 50 mL/hr at 09/18/16 0450    . acetaminophen (TYLENOL) tablet 650 mg  650 mg Oral Q6H PRN Ihor AustinPavan Pyreddy, MD       Or  . acetaminophen (TYLENOL) suppository 650 mg  650 mg Rectal Q6H PRN Pavan Pyreddy, MD      . brimonidine (ALPHAGAN) 0.2 % ophthalmic solution 1 drop  1 drop Both Eyes Q8H Pavan Pyreddy, MD      . donepezil (ARICEPT) tablet 5 mg  5 mg Oral QHS Pavan Pyreddy, MD      . dorzolamidel-timolol (COSOPT) 22.3-6.8 MG/ML ophthalmic solution SOLN 1 drop  1 drop Left Eye BID Ihor AustinPavan Pyreddy, MD      . lamoTRIgine (LAMICTAL) tablet 50 mg  50 mg Oral BID Pavan Pyreddy, MD      . latanoprost (XALATAN) 0.005 % ophthalmic solution 1 drop  1 drop Left Eye QHS Pavan Pyreddy, MD      . LORazepam (ATIVAN) tablet 0.5 mg  0.5 mg Oral BID PRN Ihor AustinPavan Pyreddy, MD      . ondansetron (ZOFRAN) tablet 4 mg  4 mg  Oral Q6H PRN Ihor Austin, MD       Or  . ondansetron (ZOFRAN) injection 4 mg  4 mg Intravenous Q6H PRN Pavan Pyreddy, MD      . senna-docusate (Senokot-S) tablet 1 tablet  1 tablet Oral QHS PRN Pavan Pyreddy, MD      . sodium chloride flush (NS) 0.9 % injection 3 mL  3 mL Intravenous Q12H Pavan Pyreddy, MD      . traZODone (DESYREL) tablet 50 mg  50 mg Oral QHS Pavan Pyreddy, MD      . vitamin B-12 (CYANOCOBALAMIN) tablet 1,000 mcg  1,000 mcg Oral Daily Ihor Austin, MD         Discharge Medications: Please see discharge summary for a list of discharge medications.  Relevant Imaging Results:  Relevant Lab Results:   Additional Information  (SSN: [id number])  Ralene Bathe, Student-Social Work

## 2016-09-18 NOTE — Discharge Instructions (Signed)
Resume diet and activity as before ° ° °

## 2016-09-18 NOTE — Discharge Summary (Signed)
[facility]   PATIENT NAME: Linda Elliott    MR#:  [id number]  DATE OF BIRTH:  05-31-1923  DATE OF ADMISSION:  09/18/2016 ADMITTING PHYSICIAN: Ihor AustinPavan Pyreddy, MD  DATE OF DISCHARGE: No discharge date for patient encounter.  PRIMARY CARE PHYSICIAN: Pcp Not In System   ADMISSION DIAGNOSIS:  Elevated troponin [R74.8] Fall, initial encounter [W19.XXXA]  DISCHARGE DIAGNOSIS:  Active Problems:   Fall   SECONDARY DIAGNOSIS:   Past Medical History:  Diagnosis Date  . Dementia   . Diverticulitis   . Glaucoma   . UTI (urinary tract infection)      ADMITTING HISTORY  HISTORY OF PRESENT ILLNESS: Linda Elliott  is a 83 y.o. female with a known history of Dementia, glaucoma, diabetes, urinary tract infection presented to the emergency room for fall. Patient lives at [facility] facility and her husband is a retired Insurance account managerneurologist from [facility]. Patient was found on the floor according to the nursing staff at the facility. She does not remember the sequence of events. She was found to have an abrasion over the right elbow and bump on the back of the head. Patient dependent on instrumental activities of daily living. She has dementia and resident of facility. She does not remember any event. No complaints of any chest pain, shortness of breath. Patient says she feels well. No fever and chills. She was evaluated in the emergency room her electrolytes were normal. CT cervical spine and CT head showed no acute abnormality. Hospitalist service was consulted for further care of the patient.  HOSPITAL COURSE:   * Fall Patient had a fall likely due to her dementia and gait abnormalities. Unclear if this was syncope. Tele showed no arrhythmias. Troponin and electrolytes normal. Echo could not be done due to patient being combative.  I discussed with Dr. Welton FlakesKhan and husband regarding patient returnting to her regular routine environment being the best course of action  for her dementia.  She will get a out patient echo with Dr. Welton FlakesKhan.  Stable for discharge back to ALF  CONSULTS OBTAINED:  Treatment Team:  Laurier NancyShaukat A Khan, MD  DRUG ALLERGIES:   Allergies  Allergen Reactions  . Penicillins Anaphylaxis    All cillins Has patient had a PCN reaction causing immediate rash, facial/tongue/throat swelling, SOB or lightheadedness with hypotension: Yes Has patient had a PCN reaction causing severe rash involving mucus membranes or skin necrosis: No Has patient had a PCN reaction that required hospitalization Yes Has patient had a PCN reaction occurring within the last 10 years: No If all of the above answers are "NO", then may proceed with Cephalosporin use.     DISCHARGE MEDICATIONS:   Current Discharge Medication List    CONTINUE these medications which have NOT CHANGED   Details  bimatoprost (LUMIGAN) 0.01 % SOLN Place 1 drop into the left eye at bedtime.    brimonidine (ALPHAGAN) 0.2 % ophthalmic solution Place into the left eye 2 (two) times daily.    donepezil (ARICEPT) 5 MG tablet Take 5 mg by mouth at bedtime.    dorzolamide-timolol (COSOPT) 22.3-6.8 MG/ML ophthalmic solution Place 1 drop into the left eye 2 (two) times daily.    hydrALAZINE (APRESOLINE) 10 MG tablet Take 1 tablet (10 mg total) by mouth 3 (three) times daily as needed (prn blood pressure greater than 180/90). Qty: 10 tablet, Refills: 0    lamoTRIgine (LAMICTAL) 25 MG tablet Take 50 mg by mouth 2 (two) times daily.    !!  LORazepam (ATIVAN) 0.5 MG tablet Take 0.5 mg by mouth daily. 1700    !! LORazepam (ATIVAN) 0.5 MG tablet Take 0.5 mg by mouth 2 (two) times daily as needed for anxiety.    traZODone (DESYREL) 50 MG tablet Take 50 mg by mouth at bedtime.    vitamin B-12 (CYANOCOBALAMIN) 1000 MCG tablet Take 1,000 mcg by mouth daily.     !! - Potential duplicate medications found. Please discuss with provider.      Today   VITAL SIGNS:  Blood pressure (!) 170/80,  pulse 83, temperature 98.2 F (36.8 C), temperature source Oral, resp. rate 18, height 5\' 4"  (1.626 m), weight 71.1 kg (156 lb 12.8 oz), SpO2 94 %.  I/O:   Intake/Output Summary (Last 24 hours) at 09/18/16 1634 Last data filed at 09/18/16 6967  Gross per 24 hour  Intake                0 ml  Output              350 ml  Net             -350 ml    PHYSICAL EXAMINATION:  Physical Exam  GENERAL:  83 y.o.-year-old patient lying in the bed with no acute distress.  LUNGS: Normal breath sounds bilaterally, no wheezing, rales,rhonchi or crepitation. No use of accessory muscles of respiration.  CARDIOVASCULAR: S1, S2 normal. No murmurs, rubs, or gallops.  ABDOMEN: Soft, non-tender, non-distended. Bowel sounds present. No organomegaly or mass.  NEUROLOGIC: Moves all 4 extremities. PSYCHIATRIC: The patient is alert and oriented x 3.  SKIN: No obvious rash, lesion, or ulcer.   DATA REVIEW:   CBC  Recent Labs Lab 09/18/16 0716  WBC 6.9  HGB 12.7  HCT 37.7  PLT 191    Chemistries   Recent Labs Lab 09/18/16 0103 09/18/16 0716  NA 137 140  K 3.8 3.7  CL 102 105  CO2 28 28  GLUCOSE 109* 105*  BUN 13 11  CREATININE 0.74 0.54  CALCIUM 8.7* 8.9  AST 21  --   ALT 13*  --   ALKPHOS 72  --   BILITOT 0.5  --     Cardiac Enzymes  Recent Labs Lab 09/18/16 1047  TROPONINI <0.03    Microbiology Results  Results for orders placed or performed during the hospital encounter of 09/18/16  MRSA PCR Screening     Status: None   Collection Time: 09/18/16  4:50 AM  Result Value Ref Range Status   MRSA by PCR NEGATIVE NEGATIVE Final    Comment:        The GeneXpert MRSA Assay (FDA approved for NASAL specimens only), is one component of a comprehensive MRSA colonization surveillance program. It is not intended to diagnose MRSA infection nor to guide or monitor treatment for MRSA infections.     RADIOLOGY:  Dg Elbow Complete Right  Result Date: 09/18/2016 CLINICAL DATA:   83 year old female with trauma to the right elbow. EXAM: RIGHT ELBOW - COMPLETE 3+ VIEW COMPARISON:  None. FINDINGS: There is no acute fracture or dislocation. The bones are osteopenic. A small exophytic bony density arising from the proximal radius likely an exostosis. There is no joint effusion. The soft tissues appear unremarkable. IMPRESSION: No acute fracture or dislocation. Electronically Signed   By: Elgie Collard M.D.   On: 09/18/2016 02:12   Ct Head Wo Contrast  Result Date: 09/18/2016 CLINICAL DATA:  Trip and fall injury. Head struck hand table.  No loss of consciousness. EXAM: CT HEAD WITHOUT CONTRAST CT CERVICAL SPINE WITHOUT CONTRAST TECHNIQUE: Multidetector CT imaging of the head and cervical spine was performed following the standard protocol without intravenous contrast. Multiplanar CT image reconstructions of the cervical spine were also generated. COMPARISON:  08/23/2016 CT head FINDINGS: CT HEAD FINDINGS Brain: Diffuse cerebral atrophy. Ventricular dilatation consistent with central atrophy. Low-attenuation changes in the deep white matter consistent with small vessel ischemia. No mass effect or midline shift. No abnormal extra-axial fluid collections. Gray-white matter junctions are distinct. Basal cisterns are not effaced. No acute intracranial hemorrhage. Vascular: Vascular calcifications are present. Skull: Calvarium appears intact. Sinuses/Orbits: Paranasal sinuses and mastoid air cells are clear. Other: No significant changes since prior study. CT CERVICAL SPINE FINDINGS Alignment: Normal. Skull base and vertebrae: No acute fracture. No primary bone lesion or focal pathologic process. Soft tissues and spinal canal: No prevertebral fluid or swelling. No visible canal hematoma. Disc levels: Degenerative changes throughout the cervical spine with narrowed disc spaces and endplate hypertrophic changes. Subcortical cyst formation. Degenerative changes throughout the facet joints. Upper  chest: Azygos lobe. Other: Vascular calcifications in the cervical carotid arteries. IMPRESSION: No acute intracranial abnormalities. Chronic atrophy and small vessel ischemic changes. Normal alignment of the cervical spine. Diffuse degenerative changes. No acute displaced fractures identified. Electronically Signed   By: Burman Nieves M.D.   On: 09/18/2016 02:37   Ct Cervical Spine Wo Contrast  Result Date: 09/18/2016 CLINICAL DATA:  Trip and fall injury. Head struck hand table. No loss of consciousness. EXAM: CT HEAD WITHOUT CONTRAST CT CERVICAL SPINE WITHOUT CONTRAST TECHNIQUE: Multidetector CT imaging of the head and cervical spine was performed following the standard protocol without intravenous contrast. Multiplanar CT image reconstructions of the cervical spine were also generated. COMPARISON:  08/23/2016 CT head FINDINGS: CT HEAD FINDINGS Brain: Diffuse cerebral atrophy. Ventricular dilatation consistent with central atrophy. Low-attenuation changes in the deep white matter consistent with small vessel ischemia. No mass effect or midline shift. No abnormal extra-axial fluid collections. Gray-white matter junctions are distinct. Basal cisterns are not effaced. No acute intracranial hemorrhage. Vascular: Vascular calcifications are present. Skull: Calvarium appears intact. Sinuses/Orbits: Paranasal sinuses and mastoid air cells are clear. Other: No significant changes since prior study. CT CERVICAL SPINE FINDINGS Alignment: Normal. Skull base and vertebrae: No acute fracture. No primary bone lesion or focal pathologic process. Soft tissues and spinal canal: No prevertebral fluid or swelling. No visible canal hematoma. Disc levels: Degenerative changes throughout the cervical spine with narrowed disc spaces and endplate hypertrophic changes. Subcortical cyst formation. Degenerative changes throughout the facet joints. Upper chest: Azygos lobe. Other: Vascular calcifications in the cervical carotid  arteries. IMPRESSION: No acute intracranial abnormalities. Chronic atrophy and small vessel ischemic changes. Normal alignment of the cervical spine. Diffuse degenerative changes. No acute displaced fractures identified. Electronically Signed   By: Burman Nieves M.D.   On: 09/18/2016 02:37   Dg Chest Port 1 View  Result Date: 09/18/2016 CLINICAL DATA:  82 year old female with fall. EXAM: PORTABLE CHEST 1 VIEW COMPARISON:  Chest radiograph dated 08/23/2016 FINDINGS: The lungs are clear. There is no pleural effusion or pneumothorax. The cardiac silhouette is within normal limits with no acute osseous pathology identified. IMPRESSION: No active disease. Electronically Signed   By: Elgie Collard M.D.   On: 09/18/2016 02:08    Follow up with PCP in 1 week.  Management plans discussed with the patient, family and they are in agreement.  CODE STATUS:  Code Status Orders        Start     Ordered   09/18/16 0407  Do not attempt resuscitation (DNR)  Continuous    Question Answer Comment  In the event of cardiac or respiratory ARREST Do not call a "code blue"   In the event of cardiac or respiratory ARREST Do not perform Intubation, CPR, defibrillation or ACLS   In the event of cardiac or respiratory ARREST Use medication by any route, position, wound care, and other measures to relive pain and suffering. May use oxygen, suction and manual treatment of airway obstruction as needed for comfort.      09/18/16 0407    Code Status History    Date Active Date Inactive Code Status Order ID Comments User Context   This patient has a current code status but no historical code status.    Advance Directive Documentation     Most Recent Value  Type of Advance Directive  Living will  Pre-existing out of facility DNR order (yellow form or pink MOST form)  -  "MOST" Form in Place?  -      TOTAL TIME TAKING CARE OF THIS PATIENT ON DAY OF DISCHARGE: more than 30 minutes.   Milagros Loll R M.D  on 09/18/2016 at 4:34 PM  Between 7am to 6pm - Pager - [phone]  After 6pm go to [email or web address] - password [email or web address]  [facility] [place] [facility]  Office  [phone]  CC: Primary care physician; Pcp Not In System  Note: This dictation was prepared with Dragon dictation along with smaller phrase technology. Any transcriptional errors that result from this process are unintentional.

## 2016-09-18 NOTE — Clinical Social Work Note (Addendum)
Clinical Social Work Assessment  Patient Details  Name: Linda Elliott MRN: [id number] Date of Birth: [birth year]  Date of referral:  09/18/16               Reason for consult:  Facility Placement, Discharge Planning                Permission sought to share information with:  Chartered certified accountant granted to share information::  Yes, Verbal Permission Granted  Name::      [place]::   [place]   Relationship::     Contact Information:     Housing/Transportation Living arrangements for the past 2 months:   Source of Information:  Patient, Adult Children, Facility Patient Interpreter Needed:  None Criminal Activity/Legal Involvement Pertinent to Current Situation/Hospitalization:  No - Comment as needed Significant Relationships:  Adult Children, Spouse Lives with:   Do you feel safe going back to the place where you live?  Yes Need for family participation in patient care:  Yes (Comment)  Care giving concerns:  Patient is a long-term resident at [facility].    Social Worker assessment / plan: Holiday representative ([place]) reviewed patient's chart and noted that patient was from [place]. Social work Theatre manager met with patient alone at bedside. Patient was lying in bed and was alert and oriented x1. Patient appeared confused, however could answer some questions. Social work Theatre manager introduced self and explained role of social work department. Per patient, she lives at [facility] but could not remember how long she has lived at the facility. Patient has one son who is a medical doctor that lives out of state. Patient requested that social work intern call patients husband, Dr. Edwyna Elliott [phone]). Social work Theatre manager spoke to patients husband. Patients husband stated that patient has been at [facility] for about 3 months now. Patient's husband is patients HPOA. Social work Theatre manager spoke to patients son Dr. Janyth Elliott who confirmed that  patient is a resident at [facility]. Patient son could not remember how long patient has been a resident was [facility]. Patient son stated that patient's husband lives at [facility] independent living. Patients son was unaware patient was in the hospital. [place] spoke with Sharyn Lull, admission coordinator at [facility]. Sharyn Lull confirmed that patient was a long-term resident at facility and can return when medically stable. Social work Theatre manager will continue to assist and follow as needed.   FL2 completed.  [facility] admissions coordinator at [facility] called [place] back and reported that patient is at ALF at [facility] and requested PT. MD requested MD to order PT consult.   Employment status:  Unemployed Forensic scientist:  Office manager) PT Recommendations:  Not assessed at this time Information / Referral to community resources:  [place]  Patient/Family's Response to care:  Patients son is agreeable for patient to return to [place]  when medically stable.   Patient/Family's Understanding of and Emotional Response to Diagnosis, Current Treatment, and Prognosis:  Patient and patients son was pleasant and thanked social work Theatre manager for visiting and calling.   Emotional Assessment Appearance:  Appears stated age Attitude/Demeanor/Rapport:    Affect (typically observed):  Accepting, Adaptable, Appropriate Orientation:  Oriented to Self Alcohol / Substance use:  Not Applicable Psych involvement (Current and /or in the community):  No (Comment)  Discharge Needs  Concerns to be addressed:  Basic Needs Readmission within the last 30 days:  No Current discharge risk:  Dependent with  Mobility Barriers to Discharge:  Continued Medical Work up   [employer], [place] Work 09/18/2016, 9:14 AM

## 2016-09-18 NOTE — Progress Notes (Signed)
This encounter was created in error - please disregard.

## 2016-09-18 NOTE — Progress Notes (Signed)
Pts husband Gerlene Burdock(Richard Espejo) stating his concern the pt is hallucinating. MD Sudini notifed of husbands concern, MD Sudini spoke with pts husband. CSW Fredric MareBailey also notified and stepped into room. Per MD Sudini  pt is ok to d/c today. Pts BP running in the 170's. Per MD ok to d/c.  Pts husband verbalized that he is in agreement to continue with discharge.

## 2016-09-18 NOTE — [place] FL2 (Signed)
[place] MEDICAID FL2 LEVEL OF CARE SCREENING TOOL     IDENTIFICATION  Patient Name: Linda Elliott Birthdate: February 17, 1923 Sex: female Admission Date (Current Location): 09/18/2016  [facility] [place]   Facility and Address:  [facility], [address], [place], [place] [address]      Provider Number: [id number]  Attending Physician Name and Address:  Milagros LollSrikar Sudini, MD  Relative Name and Phone Number:       Current Level of Care: Hospital Recommended Level of Care: Assisted Living Facility Memory Care  Prior Approval Number:    Date Approved/Denied:   PASRR Number:    Discharge Plan: ALF Memory Care     Current Diagnoses: Primary: Dementia Patient Active Problem List   Diagnosis Date Noted  . Fall 09/18/2016  . Cataract, nuclear sclerotic senile 09/22/2012  . Pseudoaphakia 09/14/2012  . Age-related cognitive decline 09/03/2012  . DD (diverticular disease) 09/03/2012  . BP (high blood pressure) 09/03/2012  . Herpes zona 09/03/2012  . Nuclear sclerotic cataract 04/13/2012  . Primary open angle glaucoma 04/13/2012    Orientation RESPIRATION BLADDER Height & Weight     Self  Normal Continent Weight: 156 lb 12.8 oz (71.1 kg) Height:  5\' 4"  (162.6 cm)  BEHAVIORAL SYMPTOMS/MOOD NEUROLOGICAL BOWEL NUTRITION STATUS   (None. )  (None. ) Continent Diet (Diet: Heart Room )  AMBULATORY STATUS COMMUNICATION OF NEEDS Skin   Limited Assist  Verbally Normal                       Personal Care Assistance Level of Assistance  Bathing, Feeding, Dressing Bathing Assistance: Limited assistance Feeding assistance: Independent Dressing Assistance: Limited assistance     Functional Limitations Info  Sight, Hearing, Speech Sight Info: Adequate Hearing Info: Impaired Speech Info: Adequate    SPECIAL CARE FACTORS FREQUENCY                    Contractures      Additional Factors Info  Code Status, Allergies Code  Status Info:  (DNR) Allergies Info:  (Penicillins)           Discharge Medications: Please see discharge summary for a list of discharge medications. DISCHARGE MEDICATIONS:      Current Discharge Medication List       CONTINUE these medications which have NOT CHANGED   Details  bimatoprost (LUMIGAN) 0.01 % SOLN Place 1 drop into the left eye at bedtime.    brimonidine (ALPHAGAN) 0.2 % ophthalmic solution Place into the left eye 2 (two) times daily.    donepezil (ARICEPT) 5 MG tablet Take 5 mg by mouth at bedtime.    dorzolamide-timolol (COSOPT) 22.3-6.8 MG/ML ophthalmic solution Place 1 drop into the left eye 2 (two) times daily.    hydrALAZINE (APRESOLINE) 10 MG tablet Take 1 tablet (10 mg total) by mouth 3 (three) times daily as needed (prn blood pressure greater than 180/90). Qty: 10 tablet, Refills: 0    lamoTRIgine (LAMICTAL) 25 MG tablet Take 50 mg by mouth 2 (two) times daily.    !! LORazepam (ATIVAN) 0.5 MG tablet Take 0.5 mg by mouth daily. 1700    !! LORazepam (ATIVAN) 0.5 MG tablet Take 0.5 mg by mouth 2 (two) times daily as needed for anxiety.    traZODone (DESYREL) 50 MG tablet Take 50 mg by mouth at bedtime.    vitamin B-12 (CYANOCOBALAMIN) 1000 MCG tablet Take 1,000 mcg by mouth daily.  Relevant Imaging Results: Relevant Lab Results: Additional Information  (SSN: [id number])  Linda Elliott, Darleen Crocker, LCSW

## 2016-09-18 NOTE — Progress Notes (Signed)
Patient is medically stable for discharge today to [facility]. Per Bari Mantis, nurse manager at [facility], patient can come back to facility. Clinical Education officer, museum (CSW) printed clinicals are in patient's discharge packet. Social work Theatre manager met with patient and patient's husband at bedside making them aware of patients discharge today back to [facility]. Patient's husband stated that patient was actively hallucinating and does not feel comfortable with patient leaving at this time. Social work Event organiser aware of above. RN will call report. Please re-consult of future social work needs arise. Social work Architectural technologist off.   Danie Chandler, Social Work Intern  [phone]

## 2016-09-18 NOTE — ED Triage Notes (Signed)
[facility] with [facility].  Pt states she got tripped up and hit the end table.  No loc  No vomting. Pt alert speech clear.

## 2016-09-18 NOTE — Discharge Planning (Signed)
Pt wheeled to car by staff. 

## 2016-09-18 NOTE — H&P (Signed)
[facility] - [facility], [facility], [facility]   PATIENT NAME: Linda Elliott    MR#:  [id number]  DATE OF BIRTH:  April 18, 1923  DATE OF ADMISSION:  09/18/2016  PRIMARY CARE PHYSICIAN: Pcp Not In System   REQUESTING/REFERRING PHYSICIAN:   CHIEF COMPLAINT:   Chief Complaint  Patient presents with  . Fall    HISTORY OF PRESENT ILLNESS: Linda Elliott  is a 83 y.o. female with a known history of Dementia, glaucoma, diabetes, urinary tract infection presented to the emergency room for fall. Patient lives at [facility] facility and her husband is a retired Insurance account manager from [employer]. Patient was found on the floor according to the nursing staff at the facility. She does not remember the sequence of events. She was found to have an abrasion over the right elbow and bump on the back of the head. Patient dependent on instrumental activities of daily living. She has dementia and resident of facility. She does not remember any event. No complaints of any chest pain, shortness of breath. Patient says she feels well. No fever and chills. She was evaluated in the emergency room her electrolytes were normal. CT cervical spine and CT head showed no acute abnormality. Hospitalist service was consulted for further care of the patient.  PAST MEDICAL HISTORY:   Past Medical History:  Diagnosis Date  . Dementia   . Diverticulitis   . Glaucoma   . UTI (urinary tract infection)     PAST SURGICAL HISTORY: Past Surgical History:  Procedure Laterality Date  . cholecytectomy    . EYE SURGERY      SOCIAL HISTORY:  Social History  Substance Use Topics  . Smoking status: Never Smoker  . Smokeless tobacco: Never Used  . Alcohol use No    FAMILY HISTORY:  Family History  Problem Relation Age of Onset  . CAD Mother     DRUG ALLERGIES:  Allergies  Allergen Reactions  . Penicillins Anaphylaxis    All cillins Has patient had a PCN reaction causing immediate rash, facial/tongue/throat  swelling, SOB or lightheadedness with hypotension: Yes Has patient had a PCN reaction causing severe rash involving mucus membranes or skin necrosis: No Has patient had a PCN reaction that required hospitalization Yes Has patient had a PCN reaction occurring within the last 10 years: No If all of the above answers are "NO", then may proceed with Cephalosporin use.     REVIEW OF SYSTEMS:   CONSTITUTIONAL: No fever, fatigue or weakness.  EYES: No blurred or double vision.  EARS, NOSE, AND THROAT: No tinnitus or ear pain.  RESPIRATORY: No cough, shortness of breath, wheezing or hemoptysis.  CARDIOVASCULAR: No chest pain, orthopnea, edema.  GASTROINTESTINAL: No nausea, vomiting, diarrhea or abdominal pain.  GENITOURINARY: No dysuria, hematuria.  ENDOCRINE: No polyuria, nocturia,  HEMATOLOGY: No anemia, easy bruising or bleeding SKIN: bruise over the right elbow, scalp hematoma. MUSCULOSKELETAL: No joint pain or arthritis.   NEUROLOGIC: No tingling, numbness, weakness.  Has dementia. PSYCHIATRY: No anxiety or depression.   MEDICATIONS AT HOME:  Prior to Admission medications   Medication Sig Start Date End Date Taking? Authorizing Provider  bimatoprost (LUMIGAN) 0.01 % SOLN Place 1 drop into the left eye at bedtime.    Historical Provider, MD  brimonidine (ALPHAGAN) 0.2 % ophthalmic solution Place into the left eye 2 (two) times daily.    Historical Provider, MD  donepezil (ARICEPT) 5 MG tablet Take 5 mg by mouth at bedtime.    Historical Provider, MD  dorzolamide-timolol (COSOPT) 22.3-6.8 MG/ML ophthalmic solution Place 1 drop into the left eye 2 (two) times daily.    Historical Provider, MD  hydrALAZINE (APRESOLINE) 10 MG tablet Take 1 tablet (10 mg total) by mouth 3 (three) times daily as needed (prn blood pressure greater than 180/90). 08/28/16   Governor Rooksebecca Lord, MD  lamoTRIgine (LAMICTAL) 25 MG tablet Take 50 mg by mouth 2 (two) times daily.    Historical Provider, MD  LORazepam  (ATIVAN) 0.5 MG tablet Take 0.5 mg by mouth daily. 1700    Historical Provider, MD  LORazepam (ATIVAN) 0.5 MG tablet Take 0.5 mg by mouth 2 (two) times daily as needed for anxiety.    Historical Provider, MD  traZODone (DESYREL) 50 MG tablet Take 50 mg by mouth at bedtime.    Historical Provider, MD  vitamin B-12 (CYANOCOBALAMIN) 1000 MCG tablet Take 1,000 mcg by mouth daily.    Historical Provider, MD      PHYSICAL EXAMINATION:   VITAL SIGNS: Blood pressure (!) 159/62, pulse 63, temperature 98.6 F (37 C), temperature source Oral, resp. rate 20, height 5\' 4"  (1.626 m), weight 68 kg (150 lb), SpO2 98 %.  GENERAL:  83 y.o.-year-old patient lying in the bed with no acute distress.  EYES: Pupils equal, round, reactive to light and accommodation. No scleral icterus. Extraocular muscles intact.  HEENT: Head  normocephalic. Oropharynx and nasopharynx clear.  Scalp hematoma on occipital area. NECK:  Supple, no jugular venous distention. No thyroid enlargement, no tenderness.  LUNGS: Normal breath sounds bilaterally, no wheezing, rales,rhonchi or crepitation. No use of accessory muscles of respiration.  CARDIOVASCULAR: S1, S2 normal. No murmurs, rubs, or gallops.  ABDOMEN: Soft, nontender, nondistended. Bowel sounds present. No organomegaly or mass.  EXTREMITIES: No pedal edema, cyanosis, or clubbing.  Abrasion right elbow area. NEUROLOGIC: Cranial nerves II through XII are intact. Muscle strength 5/5 in all extremities. Sensation intact. Gait not checked.  PSYCHIATRIC: The patient is alert and oriented x 3.  SKIN: No obvious rash, lesion, or ulcer.   LABORATORY PANEL:   CBC  Recent Labs Lab 09/18/16 0103  WBC 7.5  HGB 11.6*  HCT 35.2  PLT 188  MCV 91.1  MCH 30.1  MCHC 33.1  RDW 13.9  LYMPHSABS 2.3  MONOABS 0.9  EOSABS 0.2  BASOSABS 0.0   ------------------------------------------------------------------------------------------------------------------  Chemistries   Recent  Labs Lab 09/18/16 0103  NA 137  K 3.8  CL 102  CO2 28  GLUCOSE 109*  BUN 13  CREATININE 0.74  CALCIUM 8.7*  AST 21  ALT 13*  ALKPHOS 72  BILITOT 0.5   ------------------------------------------------------------------------------------------------------------------ estimated creatinine clearance is 43.4 mL/min (by C-G formula based on SCr of 0.74 mg/dL). ------------------------------------------------------------------------------------------------------------------ No results for input(s): TSH, T4TOTAL, T3FREE, THYROIDAB in the last 72 hours.  Invalid input(s): FREET3   Coagulation profile No results for input(s): INR, PROTIME in the last 168 hours. ------------------------------------------------------------------------------------------------------------------- No results for input(s): DDIMER in the last 72 hours. -------------------------------------------------------------------------------------------------------------------  Cardiac Enzymes  Recent Labs Lab 09/18/16 0103  TROPONINI 0.03*   ------------------------------------------------------------------------------------------------------------------ Invalid input(s): POCBNP  ---------------------------------------------------------------------------------------------------------------  Urinalysis    Component Value Date/Time   COLORURINE YELLOW (A) 09/18/2016 0104   APPEARANCEUR CLEAR (A) 09/18/2016 0104   APPEARANCEUR Turbid 09/20/2011 1004   LABSPEC 1.017 09/18/2016 0104   LABSPEC 1.021 09/20/2011 1004   PHURINE 5.0 09/18/2016 0104   GLUCOSEU NEGATIVE 09/18/2016 0104   GLUCOSEU Negative 09/20/2011 1004   HGBUR NEGATIVE 09/18/2016 0104   BILIRUBINUR NEGATIVE 09/18/2016 0104  BILIRUBINUR Negative 09/20/2011 1004   KETONESUR 5 (A) 09/18/2016 0104   PROTEINUR NEGATIVE 09/18/2016 0104   NITRITE NEGATIVE 09/18/2016 0104   LEUKOCYTESUR NEGATIVE 09/18/2016 0104   LEUKOCYTESUR 3+ 09/20/2011 1004      RADIOLOGY: Dg Elbow Complete Right  Result Date: 09/18/2016 CLINICAL DATA:  83 year old female with trauma to the right elbow. EXAM: RIGHT ELBOW - COMPLETE 3+ VIEW COMPARISON:  None. FINDINGS: There is no acute fracture or dislocation. The bones are osteopenic. A small exophytic bony density arising from the proximal radius likely an exostosis. There is no joint effusion. The soft tissues appear unremarkable. IMPRESSION: No acute fracture or dislocation. Electronically Signed   By: Elgie Collard M.D.   On: 09/18/2016 02:12   Ct Head Wo Contrast  Result Date: 09/18/2016 CLINICAL DATA:  Trip and fall injury. Head struck hand table. No loss of consciousness. EXAM: CT HEAD WITHOUT CONTRAST CT CERVICAL SPINE WITHOUT CONTRAST TECHNIQUE: Multidetector CT imaging of the head and cervical spine was performed following the standard protocol without intravenous contrast. Multiplanar CT image reconstructions of the cervical spine were also generated. COMPARISON:  08/23/2016 CT head FINDINGS: CT HEAD FINDINGS Brain: Diffuse cerebral atrophy. Ventricular dilatation consistent with central atrophy. Low-attenuation changes in the deep white matter consistent with small vessel ischemia. No mass effect or midline shift. No abnormal extra-axial fluid collections. Gray-white matter junctions are distinct. Basal cisterns are not effaced. No acute intracranial hemorrhage. Vascular: Vascular calcifications are present. Skull: Calvarium appears intact. Sinuses/Orbits: Paranasal sinuses and mastoid air cells are clear. Other: No significant changes since prior study. CT CERVICAL SPINE FINDINGS Alignment: Normal. Skull base and vertebrae: No acute fracture. No primary bone lesion or focal pathologic process. Soft tissues and spinal canal: No prevertebral fluid or swelling. No visible canal hematoma. Disc levels: Degenerative changes throughout the cervical spine with narrowed disc spaces and endplate hypertrophic changes.  Subcortical cyst formation. Degenerative changes throughout the facet joints. Upper chest: Azygos lobe. Other: Vascular calcifications in the cervical carotid arteries. IMPRESSION: No acute intracranial abnormalities. Chronic atrophy and small vessel ischemic changes. Normal alignment of the cervical spine. Diffuse degenerative changes. No acute displaced fractures identified. Electronically Signed   By: Burman Nieves M.D.   On: 09/18/2016 02:37   Ct Cervical Spine Wo Contrast  Result Date: 09/18/2016 CLINICAL DATA:  Trip and fall injury. Head struck hand table. No loss of consciousness. EXAM: CT HEAD WITHOUT CONTRAST CT CERVICAL SPINE WITHOUT CONTRAST TECHNIQUE: Multidetector CT imaging of the head and cervical spine was performed following the standard protocol without intravenous contrast. Multiplanar CT image reconstructions of the cervical spine were also generated. COMPARISON:  08/23/2016 CT head FINDINGS: CT HEAD FINDINGS Brain: Diffuse cerebral atrophy. Ventricular dilatation consistent with central atrophy. Low-attenuation changes in the deep white matter consistent with small vessel ischemia. No mass effect or midline shift. No abnormal extra-axial fluid collections. Gray-white matter junctions are distinct. Basal cisterns are not effaced. No acute intracranial hemorrhage. Vascular: Vascular calcifications are present. Skull: Calvarium appears intact. Sinuses/Orbits: Paranasal sinuses and mastoid air cells are clear. Other: No significant changes since prior study. CT CERVICAL SPINE FINDINGS Alignment: Normal. Skull base and vertebrae: No acute fracture. No primary bone lesion or focal pathologic process. Soft tissues and spinal canal: No prevertebral fluid or swelling. No visible canal hematoma. Disc levels: Degenerative changes throughout the cervical spine with narrowed disc spaces and endplate hypertrophic changes. Subcortical cyst formation. Degenerative changes throughout the facet joints.  Upper chest: Azygos lobe. Other: Vascular calcifications in  the cervical carotid arteries. IMPRESSION: No acute intracranial abnormalities. Chronic atrophy and small vessel ischemic changes. Normal alignment of the cervical spine. Diffuse degenerative changes. No acute displaced fractures identified. Electronically Signed   By: Burman Nieves M.D.   On: 09/18/2016 02:37   Dg Chest Port 1 View  Result Date: 09/18/2016 CLINICAL DATA:  83 year old female with fall. EXAM: PORTABLE CHEST 1 VIEW COMPARISON:  Chest radiograph dated 08/23/2016 FINDINGS: The lungs are clear. There is no pleural effusion or pneumothorax. The cardiac silhouette is within normal limits with no acute osseous pathology identified. IMPRESSION: No active disease. Electronically Signed   By: Elgie Collard M.D.   On: 09/18/2016 02:08    EKG: Orders placed or performed during the hospital encounter of 09/18/16  . ED EKG  . ED EKG    IMPRESSION AND PLAN: 83 year old female patient with history of dementia, glaucoma is a resident of group home facility passed out and fell down. Admitting diagnosis 1. Syncope 2. Accidental fall 3. Dementia 4. Glaucoma Treatment plan Admit patient to observation bed Telemetry monitoring Cycle troponin to rule out ischemia Cardiology consultation Check echocardiogram Gentle IV fluid hydration Supportive care.  All the records are reviewed and case discussed with ED provider. Management plans discussed with the patient, family and they are in agreement.  CODE STATUS:DNR Surrogate decision maker : Husband    Code Status Orders        Start     Ordered   09/18/16 0407  Do not attempt resuscitation (DNR)  Continuous    Question Answer Comment  In the event of cardiac or respiratory ARREST Do not call a "code blue"   In the event of cardiac or respiratory ARREST Do not perform Intubation, CPR, defibrillation or ACLS   In the event of cardiac or respiratory ARREST Use medication  by any route, position, wound care, and other measures to relive pain and suffering. May use oxygen, suction and manual treatment of airway obstruction as needed for comfort.      09/18/16 0407    Code Status History    Date Active Date Inactive Code Status Order ID Comments User Context   This patient has a current code status but no historical code status.    Advance Directive Documentation     Most Recent Value  Type of Advance Directive  Living will  Pre-existing out of facility DNR order (yellow form or pink MOST form)  -  "MOST" Form in Place?  -       TOTAL TIME TAKING CARE OF THIS PATIENT: 50 minutes.    Ihor Austin M.D on 09/18/2016 at 4:08 AM  Between 7am to 6pm - Pager - [phone]  After 6pm go to [email or web address] - password [email or web address]  [facility]  Office  [phone]  CC: Primary care physician; Pcp Not In System

## 2016-09-18 NOTE — Progress Notes (Signed)
Pt's bed alarm has rung multiple times in the past 30 minutes, pt is convinced that someone has stolen her purse, perseverating on this idea and jumping out of bed to look for same. Attempt made by staff to call husband and ask him to bring said purse, but husband did not answer the phone. Pt escalating in behavior and shouting, arguing with staff and refusing to follow directions for safety. This Clinical research associatewriter paged Dr.sudini who responded. Received verbal order for ativan ivp 0.5mg  x1. This Clinical research associatewriter overrode med in pyxus and pt received ivp. Pt also combative with staff.

## 2016-09-18 NOTE — ED Notes (Signed)
Updated patient's nurse Kennyth Arnold(Stacy) on her progress, and told her the patient will be staying with us today.

## 2016-09-18 NOTE — ED Provider Notes (Signed)
[facility] Emergency Department Provider Note  ____________________________________________   First MD Initiated Contact with Patient 09/18/16 [phone]     (approximate)  I have reviewed the triage vital signs and the nursing notes.   HISTORY  Chief Complaint Fall    HPI Tessa Seaberry is a 83 y.o. female who comes to the emergency Department after head trauma. History is obtained largely from the patient's husband at bedside who is a practicing neurologist and psychiatrist. The patient has a past medical history of dementia and history is extremely limited. Apparently she was residing at her nursing home when the nurse may have heard a thump and found a sugar on the ground. She is currently taking no aspirin, no Plavix, no blood thinning medication. She has no complaints aside from a minor headache.   Past Medical History:  Diagnosis Date  . Dementia   . Diverticulitis   . Glaucoma   . UTI (urinary tract infection)     Patient Active Problem List   Diagnosis Date Noted  . Fall 09/18/2016  . Cataract, nuclear sclerotic senile 09/22/2012  . Pseudoaphakia 09/14/2012  . Age-related cognitive decline 09/03/2012  . DD (diverticular disease) 09/03/2012  . BP (high blood pressure) 09/03/2012  . Herpes zona 09/03/2012  . Nuclear sclerotic cataract 04/13/2012  . Primary open angle glaucoma 04/13/2012    Past Surgical History:  Procedure Laterality Date  . cholecytectomy    . EYE SURGERY      Prior to Admission medications   Medication Sig Start Date End Date Taking? Authorizing Provider  bimatoprost (LUMIGAN) 0.01 % SOLN Place 1 drop into the left eye at bedtime.    Historical Provider, MD  brimonidine (ALPHAGAN) 0.2 % ophthalmic solution Place into the left eye 2 (two) times daily.    Historical Provider, MD  donepezil (ARICEPT) 5 MG tablet Take 5 mg by mouth at bedtime.    Historical Provider, MD  dorzolamide-timolol (COSOPT) 22.3-6.8 MG/ML ophthalmic  solution Place 1 drop into the left eye 2 (two) times daily.    Historical Provider, MD  hydrALAZINE (APRESOLINE) 10 MG tablet Take 1 tablet (10 mg total) by mouth 3 (three) times daily as needed (prn blood pressure greater than 180/90). 08/28/16   Governor Rooks, MD  lamoTRIgine (LAMICTAL) 25 MG tablet Take 50 mg by mouth 2 (two) times daily.    Historical Provider, MD  LORazepam (ATIVAN) 0.5 MG tablet Take 0.5 mg by mouth daily. 1700    Historical Provider, MD  LORazepam (ATIVAN) 0.5 MG tablet Take 0.5 mg by mouth 2 (two) times daily as needed for anxiety.    Historical Provider, MD  traZODone (DESYREL) 50 MG tablet Take 50 mg by mouth at bedtime.    Historical Provider, MD  vitamin B-12 (CYANOCOBALAMIN) 1000 MCG tablet Take 1,000 mcg by mouth daily.    Historical Provider, MD    Allergies Penicillins  Family History  Problem Relation Age of Onset  . CAD Mother     Social History Social History  Substance Use Topics  . Smoking status: Never Smoker  . Smokeless tobacco: Never Used  . Alcohol use No    Review of Systems Constitutional: No fever/chills Eyes: No visual changes. ENT: No sore throat. Cardiovascular: Denies chest pain. Respiratory: Denies shortness of breath. Gastrointestinal: No abdominal pain.  No nausea, no vomiting.  No diarrhea.  No constipation. Genitourinary: Negative for dysuria. Musculoskeletal: Negative for back pain. Skin: Negative for rash. Neurological: Negative for headaches, focal weakness or  numbness.  10-point ROS otherwise negative.  ____________________________________________   PHYSICAL EXAM:  VITAL SIGNS: ED Triage Vitals  Enc Vitals Group     BP 09/18/16 0038 (!) 159/62     Pulse Rate 09/18/16 0038 68     Resp 09/18/16 0038 20     Temp 09/18/16 0038 98.6 F (37 C)     Temp Source 09/18/16 0038 Oral     SpO2 09/18/16 0038 96 %     Weight 09/18/16 0039 150 lb (68 kg)     Height 09/18/16 0039 5\' 4"  (1.626 m)     Head Circumference --       Peak Flow --      Pain Score 09/18/16 0039 5     Pain Loc --      Pain Edu? --      Excl. in GC? --     Constitutional: Pleasant cooperative in no acute distress clearly demented Eyes: PERRL EOMI. Head: Atraumatic. Nose: No congestion/rhinnorhea. Mouth/Throat: No trismus Neck: No stridor.   Cardiovascular: Normal rate, regular rhythm. 3/6 systolic ejection murmur.  Good peripheral circulation. Respiratory: Normal respiratory effort.  No retractions. Lungs CTAB and moving good air Gastrointestinal: Soft nondistended nontender no rebound no guarding no peritonitis no McBurney's tenderness negative Rovsing's no costovertebral tenderness negative Murphy's Musculoskeletal: No lower extremity edema  and ecchymosis along the lateral aspect of right elbow although full range of motion right elbow to fully supinate and pronate Neurologic:  Normal speech and language. No gross focal neurologic deficits are appreciated. Skin:  Skin is warm, dry and intact. No rash noted.     ____________________________________________   DIFFERENTIAL  Syncope, mechanical fall, intracerebral hemorrhage, critical aortic stenosis, metabolic drainage. ____________________________________________   LABS (all labs ordered are listed, but only abnormal results are displayed)  Labs Reviewed  BASIC METABOLIC PANEL - Abnormal; Notable for the following:       Result Value   Glucose, Bld 109 (*)    Calcium 8.7 (*)    All other components within normal limits  HEPATIC FUNCTION PANEL - Abnormal; Notable for the following:    ALT 13 (*)    All other components within normal limits  TROPONIN I - Abnormal; Notable for the following:    Troponin I 0.03 (*)    All other components within normal limits  CBC WITH DIFFERENTIAL/PLATELET - Abnormal; Notable for the following:    Hemoglobin 11.6 (*)    All other components within normal limits  URINALYSIS, COMPLETE (UACMP) WITH MICROSCOPIC - Abnormal; Notable for  the following:    Color, Urine YELLOW (*)    APPearance CLEAR (*)    Ketones, ur 5 (*)    Squamous Epithelial / LPF 0-5 (*)    All other components within normal limits  BASIC METABOLIC PANEL - Abnormal; Notable for the following:    Glucose, Bld 105 (*)    All other components within normal limits  MRSA PCR SCREENING  TROPONIN I  TROPONIN I  CBC    Elevated troponin possibly consistent with ischemia __________________________________________  EKG  ED ECG REPORT I, [name], the attending physician, personally viewed and interpreted this ECG.  Date: 09/18/2016 Rate: 67 Rhythm: normal sinus rhythm QRS Axis: normal Intervals: First-degree AV block ST/T Wave abnormalities: normal Conduction Disturbances: none Narrative Interpretation: unremarkable  ____________________________________________  RADIOLOGY  CT scan of the head and neck negative for acute pathology ____________________________________________   PROCEDURES  Procedure(s) performed: no  Procedures  Critical Care performed: no  ____________________________________________   INITIAL IMPRESSION / ASSESSMENT AND PLAN / ED COURSE  Pertinent labs & imaging results that were available during my care of the patient were reviewed by me and considered in my medical decision making (see chart for details).  On arrival the patient has an essentially normal exam aside from obvious trauma to her right elbow. I had a discussion with the patient and her husband at bedside and we both agree that a CT scan of the head and cervical spine are warranted. Unclear if this was syncope versus mechanical fall.    Fortunately imaging is negative for acute pathology. The patient does have a slightly elevated troponin with normal renal function which could represent developing ischemia versus demand ischemia with a troponin leak. At this time it is 2:30 in the morning and the patient's husband is not comfortable having her  return to her nursing home which I agree with. I will admit her to telemetry for further risk stratification.  ____________________________________________   FINAL CLINICAL IMPRESSION(S) / ED DIAGNOSES  Final diagnoses:  Fall, initial encounter  Elevated troponin      NEW MEDICATIONS STARTED DURING THIS VISIT:  Discharge Medication List as of 09/18/2016  6:29 PM       Note:  This document was prepared using Dragon voice recognition software and may include unintentional dictation errors.     Merrily BrittleNeil Hussam Muniz, MD 09/18/16 2226

## 2016-09-18 NOTE — Progress Notes (Signed)
Dr. Elpidio Anissudini paged and responded. Notified him that pt is escalating again, attempting to get out of bed and walk out of the hospital, pt has also thrown her glass of water across the room and is attempting to punch staff. This writer told him that pt will probably not cooperate with echo like this. Received order for ativan iv x1, Dr. Elpidio Anissudini will call Dr. Park BreedKahn to see if echo can be foregone, with possible d/c of pt back to home environment later today.

## 2016-09-18 NOTE — ED Notes (Signed)
Admitting MD at bedside.

## 2016-09-18 NOTE — Evaluation (Signed)
Physical Therapy Evaluation Patient Details Name: Linda Elliott MRN: [id number] DOB: [birth year] Today's Date: 09/18/2016   History of Present Illness  presented to ER and admitted under observation after syncopal episode, found on floor at ALF.  C-spine, CT head negative for acute injury.  Clinical Impression  Upon evaluation, patient alert and oriented to self only.  Generally restless and impulsive, constantly attempting OOB and to "look for something" throughout room environment.  bilat UE/LE strength and ROM grossly WFL and symmetrical for basic transfers and mobility; unable to participate with formal MMT/ROM assessment due to cognitive deficits.  Currently requiring min assist for bed mobility; min assist for sit/stand, basic transfers and gait (100') with HHA. Narrowed BOS with increased sway at times, relying on LE step strategy for recovery.  May benefit from trial of RW, but question ability to safely utilize.  Do recommend +1 physical assist with all mobility upon discharge. Would benefit from skilled PT to address above deficits and promote optimal return to PLOF; Recommend transition to HHPT upon discharge from acute hospitalization as patient able to actively participate/progress.  Anticipate improved and optimal performance in home/familiar environment.     Follow Up Recommendations Home health PT (as patient able to participate/progress)    Equipment Recommendations       Recommendations for Other Services       Precautions / Restrictions Precautions Precautions: Fall Restrictions Weight Bearing Restrictions: No      Mobility  Bed Mobility Overal bed mobility: Needs Assistance Bed Mobility: Supine to Sit     Supine to sit: Min assist        Transfers Overall transfer level: Needs assistance   Transfers: Sit to/from Stand Sit to Stand: Min assist         General transfer comment: HHA for task initiation and guidance  Ambulation/Gait Ambulation/Gait  assistance: Min assist Ambulation Distance (Feet): 100 Feet Assistive device: 1 person hand held assist       General Gait Details: narrowed BOS, excessive sway (with head turns and dynamic gait components) relying heavily on LE step strategy for recovery.  HHA utilized to guide/direct patient due to limited ability to follow cuing from therapist.  May benefit from trial of RW in subsequent sessions, but question ability to safely utilize.  Stairs            Wheelchair Mobility    Modified Rankin (Stroke Patients Only)       Balance Overall balance assessment: Needs assistance Sitting-balance support: No upper extremity supported;Feet supported Sitting balance-Leahy Scale: Good     Standing balance support: No upper extremity supported Standing balance-Leahy Scale: Fair                               Pertinent Vitals/Pain Pain Assessment: Faces Faces Pain Scale: No hurt    Home Living Family/patient expects to be discharged to:: Assisted living Living Arrangements: Spouse/significant other               Additional Comments: Patient poor historian; unable to provide details of social history    Prior Function           Comments: Patient poor historian; unable to provide details of social history.  Per chart, husband is primary caregiver for patient.     Hand Dominance        Extremity/Trunk Assessment   Upper Extremity Assessment Upper Extremity Assessment: Overall WFL for tasks assessed  Lower Extremity Assessment Lower Extremity Assessment: Overall WFL for tasks assessed (grossly at least 4-/5; unable to participate with formal MMT due to cognitive deficits)       Communication   Communication: HOH  Cognition Arousal/Alertness: Awake/alert Behavior During Therapy: Restless;Impulsive;Agitated Overall Cognitive Status: Difficult to assess                 General Comments: oriented to self only; generally restless,  impulsive, constantly attempting OOB.  Able to redirect; poor ability to actively respond to/integrate cuing from therapist    General Comments      Exercises     Assessment/Plan    PT Assessment Patient needs continued PT services  PT Problem List Decreased activity tolerance;Decreased balance;Decreased mobility;Decreased knowledge of use of DME;Decreased safety awareness;Decreased knowledge of precautions       PT Treatment Interventions DME instruction;Gait training;Stair training;Functional mobility training;Therapeutic activities;Therapeutic exercise;Balance training;Patient/family education    PT Goals (Current goals can be found in the Care Plan section)  Acute Rehab PT Goals PT Goal Formulation: Patient unable to participate in goal setting Time For Goal Achievement: 10/02/16 Potential to Achieve Goals: Fair    Frequency Min 2X/week   Barriers to discharge        Co-evaluation               End of Session Equipment Utilized During Treatment: Gait belt Activity Tolerance: Patient tolerated treatment well Patient left: in bed;with call bell/phone within reach;with bed alarm set Nurse Communication: Mobility status PT Visit Diagnosis: Difficulty in walking, not elsewhere classified (R26.2)    Functional Assessment Tool Used: AM-PAC 6 Clicks Basic Mobility;Clinical judgement Functional Limitation: Mobility: Walking and moving around Mobility: Walking and Moving Around Current Status ([id number](G8978): At least 20 percent but less than 40 percent impaired, limited or restricted Mobility: Walking and Moving Around Goal Status [phone](G8979): At least 1 percent but less than 20 percent impaired, limited or restricted    Time: [id number] PT Time Calculation (min) (ACUTE ONLY): 9 min   Charges:   PT Evaluation $PT Eval Low Complexity: 1 Procedure     PT G Codes:   PT G-Codes **NOT FOR INPATIENT CLASS** Functional Assessment Tool Used: AM-PAC 6 Clicks Basic Mobility;Clinical  judgement Functional Limitation: Mobility: Walking and moving around Mobility: Walking and Moving Around Current Status (G4010(G8978): At least 20 percent but less than 40 percent impaired, limited or restricted Mobility: Walking and Moving Around Goal Status ([phone](G8979): At least 1 percent but less than 20 percent impaired, limited or restricted     Mcdonald Reiling H. Manson PasseyBrown, PT, DPT, NCS 09/18/16, 4:46 PM [phone]

## 2016-09-18 NOTE — ED Notes (Signed)
Pt brought in via ems from [facility].  Pt fell and struck a end table reported by ems   Pt has hematoma to forehead and back of head.  No loc.  No vomtiing. Pt alert.  Speech clear.  Family with pt.

## 2016-09-18 NOTE — ED Notes (Signed)
Pt awake and alert.  Family with pt.  Pt has abrasion to right elbow.

## 2016-09-24 ENCOUNTER — Non-Acute Institutional Stay: Payer: Medicare HMO | Admitting: Gerontology

## 2016-09-24 DIAGNOSIS — F0391 Unspecified dementia with behavioral disturbance: Secondary | ICD-10-CM | POA: Diagnosis not present

## 2016-10-05 ENCOUNTER — Encounter
Admission: RE | Admit: 2016-10-05 | Discharge: 2016-10-05 | Disposition: A | Discharge status: Home / Self Care | Payer: Medicare HMO | Source: Ambulatory Visit | Attending: Internal Medicine | Admitting: Internal Medicine

## 2016-10-05 DIAGNOSIS — F03918 Unspecified dementia, unspecified severity, with other behavioral disturbance: Secondary | ICD-10-CM | POA: Insufficient documentation

## 2016-10-05 DIAGNOSIS — F0391 Unspecified dementia with behavioral disturbance: Secondary | ICD-10-CM | POA: Insufficient documentation

## 2016-10-05 NOTE — Progress Notes (Signed)
Location:      Place of Service:  ALF (13) Provider:  Lorenso Quarry, NP-C  Pcp Not In System  Patient Care Team: Pcp Not In System as PCP - General  Extended Emergency Contact Information Primary Emergency Contact: Amirault,Dr.Richard  [place] Home Phone: [phone] Mobile Phone: [phone] Relation: Spouse Secondary Emergency Contact: Bewick,Dr.David  [place] Home Phone: [phone] Relation: Son  Code Status:  DNR Goals of care: Advanced Directive information Advanced Directives 09/18/2016  Does Patient Have a Medical Advance Directive? No  Type of Advance Directive -  Does patient want to make changes to medical advance directive? No - Patient declined  Copy of Healthcare Power of Attorney in Chart? -  Would patient like information on creating a medical advance directive? No - Patient declined     Chief Complaint  Patient presents with  . Acute Visit    HPI:  Pt is a 83 y.o. female seen today for an acute visit for increased confusion and agitation. Pt has repeatedly gotten up unassisted and fallen. She has been observed talking to "people that aren't there." She recently got combative with other residents by throwing eating utensils. She tried to push staff away when they attempt to assist her. Husband is aware and concerned. Wants her moved up to [facility] due to more staffing. Spoke at length to husband on the phone. Strongly encouraged private sitters especially at night. Husband was resistant to this and was unhappy with the staffing situation and the fact that he was going to have to pay out of pocket for the sitters. I encouraged him to speak with administration about this. Husband and I discussed medications. Husband just wants her to be safe and not fall. He understands staff is doing the best they can but that she is impulsive and combative. Will adjust meds today and follow. CT head from 3/15 reviewed.    Past Medical History:    Diagnosis Date  . Dementia   . Diverticulitis   . Glaucoma   . UTI (urinary tract infection)    Past Surgical History:  Procedure Laterality Date  . cholecytectomy    . EYE SURGERY      Allergies  Allergen Reactions  . Penicillins Anaphylaxis    All cillins Has patient had a PCN reaction causing immediate rash, facial/tongue/throat swelling, SOB or lightheadedness with hypotension: Yes Has patient had a PCN reaction causing severe rash involving mucus membranes or skin necrosis: No Has patient had a PCN reaction that required hospitalization Yes Has patient had a PCN reaction occurring within the last 10 years: No If all of the above answers are "NO", then may proceed with Cephalosporin use.     Allergies as of 09/24/2016      Reactions   Penicillins Anaphylaxis   All cillins Has patient had a PCN reaction causing immediate rash, facial/tongue/throat swelling, SOB or lightheadedness with hypotension: Yes Has patient had a PCN reaction causing severe rash involving mucus membranes or skin necrosis: No Has patient had a PCN reaction that required hospitalization Yes Has patient had a PCN reaction occurring within the last 10 years: No If all of the above answers are "NO", then may proceed with Cephalosporin use.      Medication List       Accurate as of 09/24/16 11:59 PM. Always use your most recent med list.          bimatoprost 0.01 % Soln Commonly known as:  LUMIGAN Place 1  drop into the left eye at bedtime.   brimonidine 0.2 % ophthalmic solution Commonly known as:  ALPHAGAN Place into the left eye 2 (two) times daily.   donepezil 5 MG tablet Commonly known as:  ARICEPT Take 5 mg by mouth at bedtime.   dorzolamide-timolol 22.3-6.8 MG/ML ophthalmic solution Commonly known as:  COSOPT Place 1 drop into the left eye 2 (two) times daily.   hydrALAZINE 10 MG tablet Commonly known as:  APRESOLINE Take 1 tablet (10 mg total) by mouth 3 (three) times daily as  needed (prn blood pressure greater than 180/90).   lamoTRIgine 25 MG tablet Commonly known as:  LAMICTAL Take 50 mg by mouth 2 (two) times daily.   LORazepam 0.5 MG tablet Commonly known as:  ATIVAN Take 0.5 mg by mouth daily. 1700   LORazepam 0.5 MG tablet Commonly known as:  ATIVAN Take 0.5 mg by mouth 2 (two) times daily as needed for anxiety.   traZODone 50 MG tablet Commonly known as:  DESYREL Take 50 mg by mouth at bedtime.   vitamin B-12 1000 MCG tablet Commonly known as:  CYANOCOBALAMIN Take 1,000 mcg by mouth daily.       Review of Systems  Constitutional: Negative for activity change, appetite change, chills, diaphoresis and fever.  HENT: Negative for congestion, sneezing, sore throat, trouble swallowing and voice change.   Respiratory: Negative for apnea, cough, choking, chest tightness, shortness of breath and wheezing.   Cardiovascular: Negative for chest pain, palpitations and leg swelling.  Gastrointestinal: Negative for abdominal distention, abdominal pain, constipation, diarrhea and nausea.  Genitourinary: Negative for difficulty urinating, dysuria, frequency and urgency.  Musculoskeletal: Negative for back pain, gait problem and myalgias. Arthralgias: typical arthritis.  Skin: Negative for color change, pallor, rash and wound.  Neurological: Negative for dizziness, tremors, syncope, speech difficulty, weakness, numbness and headaches.  Psychiatric/Behavioral: Positive for agitation, behavioral problems and hallucinations.  All other systems reviewed and are negative.    There is no immunization history on file for this patient. Pertinent  Health Maintenance Due  Topic Date Due  . DEXA SCAN  06/20/1990  . PNA vac Low Risk Adult (1 of 2 - PCV13) 06/20/1990  . INFLUENZA VACCINE  02/04/2017   No flowsheet data found. Functional Status Survey:    Vitals:   09/24/16 0840  BP: (!) 174/64  Pulse: 74   There is no height or weight on file to calculate  BMI. Physical Exam  Constitutional: She is oriented to person, place, and time. Vital signs are normal. She appears well-developed and well-nourished. She is active and cooperative. She does not appear ill. No distress.  HENT:  Head: Normocephalic and atraumatic.  Mouth/Throat: Uvula is midline, oropharynx is clear and moist and mucous membranes are normal. Mucous membranes are not pale, not dry and not cyanotic.  Eyes: Conjunctivae, EOM and lids are normal. Pupils are equal, round, and reactive to light.  Neck: Trachea normal, normal range of motion and full passive range of motion without pain. Neck supple. No JVD present. No tracheal deviation, no edema and no erythema present. No thyromegaly present.  Cardiovascular: Normal rate, regular rhythm, normal heart sounds, intact distal pulses and normal pulses.  Exam reveals no gallop, no distant heart sounds and no friction rub.   No murmur heard. Pulmonary/Chest: Effort normal and breath sounds normal. No accessory muscle usage. No respiratory distress. She has no wheezes. She has no rales. She exhibits no tenderness.  Abdominal: Normal appearance and bowel sounds are  normal. She exhibits no distension and no ascites. There is no tenderness.  Musculoskeletal: Normal range of motion. She exhibits no edema or tenderness.  Expected osteoarthritis, stiffness  Neurological: She is alert and oriented to person, place, and time. She has normal strength.  Skin: Skin is warm, dry and intact. No rash noted. She is not diaphoretic. No cyanosis or erythema. No pallor. Nails show no clubbing.  Psychiatric: Her mood appears anxious. Her affect is angry. Her speech is tangential. She is agitated, aggressive, actively hallucinating and combative. Thought content is delusional. Cognition and memory are impaired. She expresses impulsivity and inappropriate judgment. She exhibits abnormal recent memory.  Nursing note and vitals reviewed.   Labs reviewed:  Recent  Labs  08/28/16 1930 09/18/16 0103 09/18/16 0716  NA 139 137 140  K 3.8 3.8 3.7  CL 104 102 105  CO2 GLUCOSE 97 109* 105*  BUN CREATININE 0.78 0.74 0.54  CALCIUM 8.9 8.7* 8.9    Recent Labs  08/23/16 1831 08/28/16 1930 09/18/16 0103  AST ALT 13* 17 13*  ALKPHOS 69 65 72  BILITOT 0.9 0.8 0.5  PROT 6.9 6.8 6.5  ALBUMIN 3.9 3.7 3.8    Recent Labs  08/23/16 1831 08/28/16 1930 09/18/16 0103 09/18/16 0716  WBC 5.0 7.1 7.5 6.9  NEUTROABS 1.9 3.4 4.2  --   HGB 12.6 11.9* 11.6* 12.7  HCT 38.6 35.5 35.2 37.7  MCV 93.3 92.2 91.1 92.7  PLT 166 214 188 191   Lab Results  Component Value Date   TSH 1.518 07/10/2016   No results found for: HGBA1C Lab Results  Component Value Date   CHOL 209 (H) 10/12/2012   HDL 58 10/12/2012   LDLCALC 131 (H) 10/12/2012   TRIG 100 10/12/2012    Significant Diagnostic Results in last 30 days:  Dg Elbow Complete Right  Result Date: 09/18/2016 CLINICAL DATA:  83 year old female with trauma to the right elbow. EXAM: RIGHT ELBOW - COMPLETE 3+ VIEW COMPARISON:  None. FINDINGS: There is no acute fracture or dislocation. The bones are osteopenic. A small exophytic bony density arising from the proximal radius likely an exostosis. There is no joint effusion. The soft tissues appear unremarkable. IMPRESSION: No acute fracture or dislocation. Electronically Signed   By: Elgie Collard M.D.   On: 09/18/2016 02:12   Ct Head Wo Contrast  Result Date: 09/18/2016 CLINICAL DATA:  Trip and fall injury. Head struck hand table. No loss of consciousness. EXAM: CT HEAD WITHOUT CONTRAST CT CERVICAL SPINE WITHOUT CONTRAST TECHNIQUE: Multidetector CT imaging of the head and cervical spine was performed following the standard protocol without intravenous contrast. Multiplanar CT image reconstructions of the cervical spine were also generated. COMPARISON:  08/23/2016 CT head FINDINGS: CT HEAD FINDINGS Brain: Diffuse cerebral atrophy.  Ventricular dilatation consistent with central atrophy. Low-attenuation changes in the deep white matter consistent with small vessel ischemia. No mass effect or midline shift. No abnormal extra-axial fluid collections. Gray-white matter junctions are distinct. Basal cisterns are not effaced. No acute intracranial hemorrhage. Vascular: Vascular calcifications are present. Skull: Calvarium appears intact. Sinuses/Orbits: Paranasal sinuses and mastoid air cells are clear. Other: No significant changes since prior study. CT CERVICAL SPINE FINDINGS Alignment: Normal. Skull base and vertebrae: No acute fracture. No primary bone lesion or focal pathologic process. Soft tissues and spinal canal: No prevertebral fluid or swelling. No visible canal hematoma. Disc levels: Degenerative changes throughout the cervical spine with  narrowed disc spaces and endplate hypertrophic changes. Subcortical cyst formation. Degenerative changes throughout the facet joints. Upper chest: Azygos lobe. Other: Vascular calcifications in the cervical carotid arteries. IMPRESSION: No acute intracranial abnormalities. Chronic atrophy and small vessel ischemic changes. Normal alignment of the cervical spine. Diffuse degenerative changes. No acute displaced fractures identified. Electronically Signed   By: Burman Nieves M.D.   On: 09/18/2016 02:37   Ct Cervical Spine Wo Contrast  Result Date: 09/18/2016 CLINICAL DATA:  Trip and fall injury. Head struck hand table. No loss of consciousness. EXAM: CT HEAD WITHOUT CONTRAST CT CERVICAL SPINE WITHOUT CONTRAST TECHNIQUE: Multidetector CT imaging of the head and cervical spine was performed following the standard protocol without intravenous contrast. Multiplanar CT image reconstructions of the cervical spine were also generated. COMPARISON:  08/23/2016 CT head FINDINGS: CT HEAD FINDINGS Brain: Diffuse cerebral atrophy. Ventricular dilatation consistent with central atrophy. Low-attenuation changes  in the deep white matter consistent with small vessel ischemia. No mass effect or midline shift. No abnormal extra-axial fluid collections. Gray-white matter junctions are distinct. Basal cisterns are not effaced. No acute intracranial hemorrhage. Vascular: Vascular calcifications are present. Skull: Calvarium appears intact. Sinuses/Orbits: Paranasal sinuses and mastoid air cells are clear. Other: No significant changes since prior study. CT CERVICAL SPINE FINDINGS Alignment: Normal. Skull base and vertebrae: No acute fracture. No primary bone lesion or focal pathologic process. Soft tissues and spinal canal: No prevertebral fluid or swelling. No visible canal hematoma. Disc levels: Degenerative changes throughout the cervical spine with narrowed disc spaces and endplate hypertrophic changes. Subcortical cyst formation. Degenerative changes throughout the facet joints. Upper chest: Azygos lobe. Other: Vascular calcifications in the cervical carotid arteries. IMPRESSION: No acute intracranial abnormalities. Chronic atrophy and small vessel ischemic changes. Normal alignment of the cervical spine. Diffuse degenerative changes. No acute displaced fractures identified. Electronically Signed   By: Burman Nieves M.D.   On: 09/18/2016 02:37   Mr Abdomen W Wo Contrast  Result Date: 09/06/2016 CLINICAL DATA:  Followup pancreatic ductal dilatation from recent CT scan. EXAM: MRI ABDOMEN WITHOUT AND WITH CONTRAST TECHNIQUE: Multiplanar multisequence MR imaging of the abdomen was performed both before and after the administration of intravenous contrast. CONTRAST:  15 cc MultiHance COMPARISON:  CT scan 08/23/2016 FINDINGS: Examination is extremely limited. This 83 year old with dementia could not hold still or follow directions. Lower chest: No obvious pulmonary lesions, pleural or pericardial effusion. Hepatobiliary: Grossly normal. Pancreas: Chronic severe pancreatic atrophy with a dilated pancreatic duct in the body  and tail regions. However, this was present back in 2013 and is probably the result of remote inflammatory disease with a ductal stricture. There is no mass identified for certain on the MRI or the recent CT scan. Spleen:  Grossly normal. Adrenals/Urinary Tract:  Stable bilateral renal cysts. Stomach/Bowel: Very limited evaluation.  No gross abnormality. Vascular/Lymphatic:  No gross abnormality. Other:  No ascites. Musculoskeletal: No gross abnormality. IMPRESSION: 1. Very limited examination due to patient motion. 2. Chronic pancreatic atrophy and pancreatic ductal dilatation dating back to 2013. No obvious pancreatic lesion/mass. 3. The remainder the examination is grossly normal but quite limited. Electronically Signed   By: Rudie Meyer M.D.   On: 09/06/2016 10:43   Dg Chest Port 1 View  Result Date: 09/18/2016 CLINICAL DATA:  83 year old female with fall. EXAM: PORTABLE CHEST 1 VIEW COMPARISON:  Chest radiograph dated 08/23/2016 FINDINGS: The lungs are clear. There is no pleural effusion or pneumothorax. The cardiac silhouette is within normal limits with no acute  osseous pathology identified. IMPRESSION: No active disease. Electronically Signed   By: Elgie Collard M.D.   On: 09/18/2016 02:08    Assessment/Plan 1. Dementia with behavioral disturbance, unspecified dementia type  D/C Seroquel 25 mg po BID  Seroquel 50 mg po BID  Recommended sitters for pt, especially at night  Family/ staff Communication:   Total Time:  Documentation:  Face to Face:  Family/Phone: spoke with husband for ~15 minutes via telephone   Labs/tests ordered:    Medication list reviewed and assessed for continued appropriateness.  Brynda Rim, NP-C [facility] [phone], [place] [address] Cell Phone (Mon-Fri 8am-5pm):  [phone] On Call:  [phone] & follow prompts after 5pm & weekends Office Phone:  [phone] Office Fax:   [phone]

## 2016-10-21 ENCOUNTER — Emergency Department: Payer: Medicare HMO

## 2016-10-21 ENCOUNTER — Emergency Department
Admission: EM | Admit: 2016-10-21 | Discharge: 2016-10-21 | Disposition: A | Discharge status: Home / Self Care | Payer: Medicare HMO | Attending: Emergency Medicine | Admitting: Emergency Medicine

## 2016-10-21 DIAGNOSIS — Y999 Unspecified external cause status: Secondary | ICD-10-CM | POA: Insufficient documentation

## 2016-10-21 DIAGNOSIS — R51 Headache: Secondary | ICD-10-CM | POA: Diagnosis not present

## 2016-10-21 DIAGNOSIS — Y939 Activity, unspecified: Secondary | ICD-10-CM | POA: Insufficient documentation

## 2016-10-21 DIAGNOSIS — Z5181 Encounter for therapeutic drug level monitoring: Secondary | ICD-10-CM | POA: Insufficient documentation

## 2016-10-21 DIAGNOSIS — S0990XA Unspecified injury of head, initial encounter: Secondary | ICD-10-CM | POA: Diagnosis present

## 2016-10-21 DIAGNOSIS — Z79899 Other long term (current) drug therapy: Secondary | ICD-10-CM | POA: Insufficient documentation

## 2016-10-21 DIAGNOSIS — Y92129 Unspecified place in nursing home as the place of occurrence of the external cause: Secondary | ICD-10-CM | POA: Diagnosis not present

## 2016-10-21 DIAGNOSIS — W19XXXA Unspecified fall, initial encounter: Secondary | ICD-10-CM | POA: Insufficient documentation

## 2016-10-21 LAB — URINALYSIS, COMPLETE (UACMP) WITH MICROSCOPIC
BILIRUBIN URINE: NEGATIVE
Bacteria, UA: NONE SEEN
GLUCOSE, UA: NEGATIVE mg/dL
HGB URINE DIPSTICK: NEGATIVE
KETONES UR: NEGATIVE mg/dL
LEUKOCYTES UA: NEGATIVE
NITRITE: NEGATIVE
PH: 7 (ref 5.0–8.0)
Protein, ur: NEGATIVE mg/dL
SPECIFIC GRAVITY, URINE: 1.013 (ref 1.005–1.030)

## 2016-10-21 LAB — COMPREHENSIVE METABOLIC PANEL
ALT: 8 U/L — AB (ref 14–54)
AST: 25 U/L (ref 15–41)
Albumin: 4 g/dL (ref 3.5–5.0)
Alkaline Phosphatase: 91 U/L (ref 38–126)
Anion gap: 7 (ref 5–15)
BILIRUBIN TOTAL: 0.7 mg/dL (ref 0.3–1.2)
BUN: 12 mg/dL (ref 6–20)
CO2: 28 mmol/L (ref 22–32)
Calcium: 8.9 mg/dL (ref 8.9–10.3)
Chloride: 102 mmol/L (ref 101–111)
Creatinine, Ser: 0.87 mg/dL (ref 0.44–1.00)
GFR, EST NON AFRICAN AMERICAN: 57 mL/min — AB (ref >60)
GLUCOSE: 132 mg/dL — AB (ref 65–99)
POTASSIUM: 4.1 mmol/L (ref 3.5–5.1)
Sodium: 137 mmol/L (ref 135–145)
Total Protein: 7 g/dL (ref 6.5–8.1)

## 2016-10-21 LAB — CBC WITH DIFFERENTIAL/PLATELET
BASOS ABS: 0.1 10*3/uL (ref 0–0.1)
BASOS PCT: 1 %
Eosinophils Absolute: 0.2 10*3/uL (ref 0–0.7)
Eosinophils Relative: 3 %
HEMATOCRIT: 36.6 % (ref 35.0–47.0)
HEMOGLOBIN: 12.1 g/dL (ref 12.0–16.0)
LYMPHS PCT: 31 %
Lymphs Abs: 2.4 10*3/uL (ref 1.0–3.6)
MCH: 30.1 pg (ref 26.0–34.0)
MCHC: 33.1 g/dL (ref 32.0–36.0)
MCV: 90.9 fL (ref 80.0–100.0)
MONO ABS: 0.8 10*3/uL (ref 0.2–0.9)
MONOS PCT: 11 %
NEUTROS PCT: 54 %
Neutro Abs: 4.3 10*3/uL (ref 1.4–6.5)
Platelets: 202 10*3/uL (ref 150–440)
RBC: 4.03 MIL/uL (ref 3.80–5.20)
RDW: 14.4 % (ref 11.5–14.5)
WBC: 7.8 10*3/uL (ref 3.6–11.0)

## 2016-10-21 LAB — CK: Total CK: 63 U/L (ref 38–234)

## 2016-10-21 LAB — TROPONIN I: Troponin I: <0.03 ng/mL (ref <0.03)

## 2016-10-21 LAB — PROTIME-INR
INR: 1.1
PROTHROMBIN TIME: 14.2 s (ref 11.4–15.2)

## 2016-10-21 NOTE — ED Provider Notes (Signed)
[facility] Emergency Department Provider Note  ____________________________________________   First MD Initiated Contact with Patient 10/21/16 2045     (approximate)  I have reviewed the triage vital signs and the nursing notes.   HISTORY  Chief Complaint Fall  History is challenging to obtain secondary to the patient's dementia  HPI Linda Elliott is a 83 y.o. female who comes to the emergency department via EMS after falling at her assisted living facility. The patient has significant dementia and said that the last thing she remembers was standing up and then her husband was assisting her to the nurse at the facility. She does not think she has walked ever since the fall. She does not have any pain right now aside from moderate severity nonradiating aching pain in the back of her right head she has no numbness or weakness. No chest pain or shortness of breath. No abdominal pain nausea or vomiting. She's not sure if she passed out or if she fell because she tripped.   Past Medical History:  Diagnosis Date  . Dementia   . Diverticulitis   . Glaucoma   . UTI (urinary tract infection)     Patient Active Problem List   Diagnosis Date Noted  . Dementia with behavioral disturbance 10/05/2016  . Fall 09/18/2016  . Cataract, nuclear sclerotic senile 09/22/2012  . Pseudoaphakia 09/14/2012  . Age-related cognitive decline 09/03/2012  . DD (diverticular disease) 09/03/2012  . BP (high blood pressure) 09/03/2012  . Herpes zona 09/03/2012  . Nuclear sclerotic cataract 04/13/2012  . Primary open angle glaucoma 04/13/2012    Past Surgical History:  Procedure Laterality Date  . cholecytectomy    . EYE SURGERY      Prior to Admission medications   Medication Sig Start Date End Date Taking? Authorizing Provider  alum & mag hydroxide-simeth (MAALOX/MYLANTA) 200-200-20 MG/5ML suspension Take 30 mLs by mouth every 4 (four) hours as needed for indigestion or  heartburn.   Yes Historical Provider, MD  bimatoprost (LUMIGAN) 0.01 % SOLN Place 1 drop into the left eye at bedtime.   Yes Historical Provider, MD  brimonidine (ALPHAGAN) 0.2 % ophthalmic solution Place into the left eye 2 (two) times daily.   Yes Historical Provider, MD  dicyclomine (BENTYL) 20 MG tablet Take 20 mg by mouth every 6 (six) hours.   Yes Historical Provider, MD  donepezil (ARICEPT) 5 MG tablet Take 5 mg by mouth at bedtime.   Yes Historical Provider, MD  dorzolamide-timolol (COSOPT) 22.3-6.8 MG/ML ophthalmic solution Place 1 drop into the left eye 2 (two) times daily.   Yes Historical Provider, MD  hydrALAZINE (APRESOLINE) 10 MG tablet Take 1 tablet (10 mg total) by mouth 3 (three) times daily as needed (prn blood pressure greater than 180/90). 08/28/16  Yes Governor Rooks, MD  LamoTRIgine 50 MG TBDP Take 50-100 mg by mouth 2 (two) times daily. tk 50 mg qam and 100 mg qhs   Yes Historical Provider, MD  LORazepam (ATIVAN) 0.5 MG tablet Take 0.5 mg by mouth daily. 1700   Yes Historical Provider, MD  naproxen (NAPROSYN) 250 MG tablet Take 250 mg by mouth 2 (two) times daily with a meal.   Yes Historical Provider, MD  QUEtiapine (SEROQUEL) 50 MG tablet Take 50 mg by mouth 2 (two) times daily.   Yes Historical Provider, MD  sucralfate (CARAFATE) 1 g tablet Take 1 g by mouth 4 (four) times daily.   Yes Historical Provider, MD  traZODone (DESYREL) 50 MG  tablet Take 50 mg by mouth at bedtime.   Yes Historical Provider, MD  vitamin B-12 (CYANOCOBALAMIN) 1000 MCG tablet Take 1,000 mcg by mouth daily.   Yes Historical Provider, MD    Allergies Penicillins  Family History  Problem Relation Age of Onset  . CAD Mother     Social History Social History  Substance Use Topics  . Smoking status: Never Smoker  . Smokeless tobacco: Never Used  . Alcohol use No    Review of Systems Level V exemption history Limited by the patient's dementia 10-point ROS otherwise  negative.  ____________________________________________   PHYSICAL EXAM:  VITAL SIGNS: ED Triage Vitals [10/21/16 2037]  Enc Vitals Group     BP      Pulse      Resp      Temp 97.9 F (36.6 C)     Temp Source Oral     SpO2      Weight 150 lb (68 kg)     Height  (1.6 m)     Head Circumference      Peak Flow      Pain Score 5     Pain Loc      Pain Edu?      Excl. in GC?     Constitutional: Pleasant cooperative significant dementia Eyes: PERRL EOMI. Head: No lacerations that hematoma to left occipital the right side of her face is ecchymotic with old bruises in multiple stages of healing. Nose: No congestion/rhinnorhea. Mouth/Throat: No trismus Neck: No stridor.   Cardiovascular: Normal rate, regular rhythm. Grossly normal heart sounds.  Good peripheral circulation. Respiratory: Normal respiratory effort.  No retractions. Lungs CTAB and moving good air Gastrointestinal: Soft nondistended nontender Musculoskeletal: No pain with log roll of either leg full range of motion bilateral hips and no discomfort with internal or external rotation of hips  Neurologic:  Normal speech and language. No gross focal neurologic deficits are appreciated. Skin:  Skin is warm, dry and intact. No rash noted. Psychiatric: Mood and affect are normal. Speech and behavior are normal.    ____________________________________________   DIFFERENTIAL  Syncope, mechanical fall ____________________________________________   LABS (all labs ordered are listed, but only abnormal results are displayed)  Labs Reviewed  COMPREHENSIVE METABOLIC PANEL - Abnormal; Notable for the following:       Result Value   Glucose, Bld 132 (*)    ALT 8 (*)    GFR calc non Af Amer 57 (*)    All other components within normal limits  URINALYSIS, COMPLETE (UACMP) WITH MICROSCOPIC - Abnormal; Notable for the following:    Color, Urine YELLOW (*)    APPearance CLEAR (*)    Squamous Epithelial / LPF 0-5 (*)     All other components within normal limits  CBC WITH DIFFERENTIAL/PLATELET  TROPONIN I  PROTIME-INR  CK    No signs of urinary tract infection or acute ischemia __________________________________________  EKG  ED ECG REPORT I, [name], the attending physician, personally viewed and interpreted this ECG.  Date: 10/22/2016 Rate: 66 Rhythm: normal sinus rhythm QRS Axis: normal Intervals: Prolonged PR ST/T Wave abnormalities: normal Conduction Disturbances: Nonspecific intraventricular conduction delay Narrative Interpretation: Abnormal _______________________________________  RADIOLOGY  CT scan of the head and neck were negative for acute pathology ____________________________________________   PROCEDURES  Procedure(s) performed: no  Procedures  Critical Care performed: no  ____________________________________________   INITIAL IMPRESSION / ASSESSMENT AND PLAN / ED COURSE  Pertinent labs & imaging results that were  available during my care of the patient were reviewed by me and considered in my medical decision making (see chart for details).  On arrival the patient has obvious new trauma to her left occipital as well as several bruises in various degrees of healing. When I admitted her a month ago she remained on telemetry for 24 hours with no events. Dr. Welton Flakes her cardiologist evaluated her and initially recommended an echocardiogram to evaluate for structural causes of syncope however was never able to be performed.     ----------------------------------------- 9:29 PM on 10/21/2016 -----------------------------------------  Collateral history provided by the patient's husband at bedside who is a still practicing neurologist. According to the husband the patient has significant vascular dementia as well as glaucoma and is nearly blind. ____________________________________________   Fortunately the patient's imaging is negative for acute pathology. I  discussed the case with the patient's physician husband and we both agreed that she warrants follow-up with her cardiologist within the next 2 days for reevaluation and likely for an echocardiogram and possible Holter monitor. She is discharged home in improved and stable condition.  FINAL CLINICAL IMPRESSION(S) / ED DIAGNOSES  Final diagnoses:  Fall, initial encounter      NEW MEDICATIONS STARTED DURING THIS VISIT:  Discharge Medication List as of 10/21/2016 11:18 PM       Note:  This document was prepared using Dragon voice recognition software and may include unintentional dictation errors.     [name], MD 10/22/16 [phone]

## 2016-10-21 NOTE — ED Notes (Signed)
Pt discharged to home.  Family member driving.  Discharge instructions reviewed.  Verbalized understanding.  No questions or concerns at this time.  Teach back verified.  Pt in NAD.  No items left in ED.   

## 2016-10-21 NOTE — Discharge Instructions (Signed)
Please have Linda Elliott follow-up with Dr. Welton Flakes for reevaluation. She very well may need an echocardiogram and possibly a Holter monitor. Have her return to the emergency department sooner for any new or worsening symptoms.  It was a pleasure to take care of you today, and thank you for coming to our emergency department.  If you have any questions or concerns before leaving please ask the nurse to grab me and I'm more than happy to go through your aftercare instructions again.  If you were prescribed any opioid pain medication today such as Norco, Vicodin, Percocet, morphine, hydrocodone, or oxycodone please make sure you do not drive when you are taking this medication as it can alter your ability to drive safely.  If you have any concerns once you are home that you are not improving or are in fact getting worse before you can make it to your follow-up appointment, please do not hesitate to call 911 and come back for further evaluation.  Linda Brittle MD  Results for orders placed or performed during the hospital encounter of 10/21/16  Comprehensive metabolic panel  Result Value Ref Range   Sodium 137 135 - 145 mmol/L   Potassium 4.1 3.5 - 5.1 mmol/L   Chloride 102 101 - 111 mmol/L   CO2 28 22 - 32 mmol/L   Glucose, Bld 132 (H) 65 - 99 mg/dL   BUN 12 6 - 20 mg/dL   Creatinine, Ser 1.61 0.44 - 1.00 mg/dL   Calcium 8.9 8.9 - 09.6 mg/dL   Total Protein 7.0 6.5 - 8.1 g/dL   Albumin 4.0 3.5 - 5.0 g/dL   AST 25 15 - 41 U/L   ALT 8 (L) 14 - 54 U/L   Alkaline Phosphatase 91 38 - 126 U/L   Total Bilirubin 0.7 0.3 - 1.2 mg/dL   GFR calc non Af Amer 57 (L) >60 mL/min   GFR calc Af Amer >60 >60 mL/min   Anion gap 7 5 - 15  CBC with Differential  Result Value Ref Range   WBC 7.8 3.6 - 11.0 K/uL   RBC 4.03 3.80 - 5.20 MIL/uL   Hemoglobin 12.1 12.0 - 16.0 g/dL   HCT 04.5 40.9 - 81.1 %   MCV 90.9 80.0 - 100.0 fL   MCH 30.1 26.0 - 34.0 pg   MCHC 33.1 32.0 - 36.0 g/dL   RDW 91.4 78.2 - 95.6 %   Platelets 202 150 - 440 K/uL   Neutrophils Relative % 54 %   Neutro Abs 4.3 1.4 - 6.5 K/uL   Lymphocytes Relative 31 %   Lymphs Abs 2.4 1.0 - 3.6 K/uL   Monocytes Relative 11 %   Monocytes Absolute 0.8 0.2 - 0.9 K/uL   Eosinophils Relative 3 %   Eosinophils Absolute 0.2 0 - 0.7 K/uL   Basophils Relative 1 %   Basophils Absolute 0.1 0 - 0.1 K/uL  Troponin I  Result Value Ref Range   Troponin I <0.03 <0.03 ng/mL  Protime-INR  Result Value Ref Range   Prothrombin Time 14.2 11.4 - 15.2 seconds   INR 1.10   Urinalysis, Complete w Microscopic  Result Value Ref Range   Color, Urine YELLOW (A) YELLOW   APPearance CLEAR (A) CLEAR   Specific Gravity, Urine 1.013 1.005 - 1.030   pH 7.0 5.0 - 8.0   Glucose, UA NEGATIVE NEGATIVE mg/dL   Hgb urine dipstick NEGATIVE NEGATIVE   Bilirubin Urine NEGATIVE NEGATIVE   Ketones, ur NEGATIVE NEGATIVE  mg/dL   Protein, ur NEGATIVE NEGATIVE mg/dL   Nitrite NEGATIVE NEGATIVE   Leukocytes, UA NEGATIVE NEGATIVE   RBC / HPF 0-5 0 - 5 RBC/hpf   WBC, UA 0-5 0 - 5 WBC/hpf   Bacteria, UA NONE SEEN NONE SEEN   Squamous Epithelial / LPF 0-5 (A) NONE SEEN   Mucous PRESENT   CK  Result Value Ref Range   Total CK 63 38 - 234 U/L   Dg Chest 1 View  Result Date: 10/21/2016 CLINICAL DATA:  Unwitnessed fall. Occipital trauma. History of dementia. Nonsmoker. EXAM: CHEST 1 VIEW COMPARISON:  09/18/2016 FINDINGS: Shallow inspiration with mild linear atelectasis in the left lung base. Azygos lobe. No focal consolidation or airspace disease. No blunting of costophrenic angles. No pneumothorax. Calcified and tortuous aorta. Degenerative changes in the spine and shoulders. IMPRESSION: Shallow inspiration with linear atelectasis in the left lung base. Electronically Signed   By: Burman Nieves M.D.   On: 10/21/2016 21:29   Ct Head Wo Contrast  Result Date: 10/21/2016 CLINICAL DATA:  Unwitnessed fall. Loss of consciousness. Back of head hurts. EXAM: CT HEAD WITHOUT  CONTRAST CT CERVICAL SPINE WITHOUT CONTRAST TECHNIQUE: Multidetector CT imaging of the head and cervical spine was performed following the standard protocol without intravenous contrast. Multiplanar CT image reconstructions of the cervical spine were also generated. COMPARISON:  09/18/2016 FINDINGS: CT HEAD FINDINGS Brain: Diffuse cerebral atrophy. Ventricular dilatation consistent with central atrophy. Low-attenuation changes in the deep white matter consistent with small vessel ischemia. No mass effect or midline shift. No abnormal extra-axial fluid collections. Gray-white matter junctions are distinct. Basal cisterns are not effaced. No acute intracranial hemorrhage. Vascular: Vascular calcifications are present. 6 mm bulbous prominence of the left internal carotid bifurcation. This may just be due to tortuous vascularity but intracranial aneurysm is not excluded. Consider follow-up with CT angiography or MR angiography for correlation. No acute hemorrhage. Skull: The calvarium appears intact.  No depressed skull fractures. Sinuses/Orbits: Postoperative changes in the globes. Paranasal sinuses and mastoid air cells are clear. Other: None. CT CERVICAL SPINE FINDINGS Alignment: Normal alignment of the cervical vertebrae and facet joints. C1-2 articulation appears intact. Skull base and vertebrae: Skullbase is unremarkable. No vertebral compression deformities. No focal bone lesion or bone destruction. Bone cortex appears intact. Subcortical degenerative changes at the superior endplates at C5, C6, and C7. Soft tissues and spinal canal: No prevertebral fluid or swelling. No visible canal hematoma. Disc levels: Degenerative changes throughout the cervical spine with narrowed cervical interspaces and endplate hypertrophic changes throughout. Degenerative changes throughout the facet joints. Upper chest: Azygos lobe.  Lung apices are clear. Other: Vascular calcifications in the cervical carotid arteries. IMPRESSION: No  acute intracranial abnormalities. Chronic atrophy and small vessel ischemic changes. 6 mm bulbous prominence of the left internal carotid bifurcation. This could be due to tortuous vascularity but intracranial aneurysm is not excluded. Consider follow-up with CT angiography or MR angiography for correlation. No acute hemorrhage. Normal alignment of the cervical spine. Diffuse degenerative changes. No acute displaced fractures identified. Electronically Signed   By: Burman Nieves M.D.   On: 10/21/2016 22:01   Ct Cervical Spine Wo Contrast  Result Date: 10/21/2016 CLINICAL DATA:  Unwitnessed fall today. EXAM: CT HEAD WITHOUT CONTRAST CT CERVICAL SPINE WITHOUT CONTRAST TECHNIQUE: Multidetector CT imaging of the head and cervical spine was performed following the standard protocol without intravenous contrast. Multiplanar CT image reconstructions of the cervical spine were also generated. COMPARISON:  09/18/2016 FINDINGS: CT HEAD FINDINGS  Brain: Ventricles, cisterns and other CSF spaces are within normal. There is moderate chronic ischemic microvascular disease. Possible small old focal infarct of the right cerebellar hemisphere. No evidence of focal mass, mass effect, shift of midline structures or acute hemorrhage. No evidence of acute infarction. Vascular: Moderate calcified plaque over the cavernous segment of the internal carotid arteries. Skull: Within normal. Sinuses/Orbits: Unchanged. Other: Minimal soft tissue swelling over the right supraorbital region. CT CERVICAL SPINE FINDINGS Alignment: Within normal. Skull base and vertebrae: Vertebral body heights are normal. There is moderate spondylosis throughout the cervical spine. Atlantoaxial articulation is within normal. There is moderate uncovertebral joint spurring and facet arthropathy. There is significant bilateral neural foraminal narrowing at multiple levels due to adjacent bony spurring. No evidence of acute fracture. Soft tissues and spinal canal:  Prevertebral soft tissues are normal. Spinal canal is within normal. Disc levels: Multilevel disc space narrowing from the C3-4 level to the C6-7 level. Upper chest: Mild calcified plaque at the carotid bifurcations. Other: None. IMPRESSION: No acute intracranial findings. Chronic ischemic microvascular disease. Minimal soft tissue swelling over the right supraorbital region. No acute cervical spine injury. Moderate spondylosis throughout the cervical spine with multilevel disc disease and significant multilevel bilateral neural foraminal narrowing. Electronically Signed   By: Elberta Fortis M.D.   On: 10/21/2016 21:52

## 2016-10-21 NOTE — ED Triage Notes (Signed)
Pt brought in by [facility] from [facility].  Pt lives with her husband.  Pt's fall was unwitnessed by anyone.  Pt states she was standing, and the next thing she remembers is her husband taking her to the nurses station.  Pt states that he back of her head hurts.  Per EMS, pt is [facility] and has hx of dementia.  Pt alert and oriented to self and situation at this time.

## 2016-10-27 ENCOUNTER — Non-Acute Institutional Stay: Payer: Medicare HMO | Admitting: Gerontology

## 2016-10-27 DIAGNOSIS — R600 Localized edema: Secondary | ICD-10-CM

## 2016-11-04 ENCOUNTER — Encounter
Admission: RE | Admit: 2016-11-04 | Discharge: 2016-11-04 | Disposition: A | Discharge status: Home / Self Care | Payer: Medicare HMO | Source: Ambulatory Visit | Attending: Internal Medicine | Admitting: Internal Medicine

## 2016-11-04 DIAGNOSIS — R109 Unspecified abdominal pain: Secondary | ICD-10-CM | POA: Insufficient documentation

## 2016-11-04 DIAGNOSIS — F0281 Dementia in other diseases classified elsewhere with behavioral disturbance: Secondary | ICD-10-CM | POA: Insufficient documentation

## 2016-11-13 ENCOUNTER — Encounter: Payer: Self-pay | Admitting: Gerontology

## 2016-11-13 DIAGNOSIS — W19XXXA Unspecified fall, initial encounter: Secondary | ICD-10-CM

## 2016-11-13 DIAGNOSIS — S0101XA Laceration without foreign body of scalp, initial encounter: Secondary | ICD-10-CM

## 2016-11-19 DIAGNOSIS — R109 Unspecified abdominal pain: Secondary | ICD-10-CM | POA: Diagnosis present

## 2016-11-19 DIAGNOSIS — F0281 Dementia in other diseases classified elsewhere with behavioral disturbance: Secondary | ICD-10-CM | POA: Diagnosis not present

## 2016-11-19 LAB — CBC WITH DIFFERENTIAL/PLATELET
Basophils Absolute: 0 10*3/uL (ref 0–0.1)
Basophils Relative: 0 %
Eosinophils Absolute: 0.1 10*3/uL (ref 0–0.7)
Eosinophils Relative: 2 %
HCT: 34 % — ABNORMAL LOW (ref 35.0–47.0)
HEMOGLOBIN: 11.4 g/dL — AB (ref 12.0–16.0)
LYMPHS ABS: 2.3 10*3/uL (ref 1.0–3.6)
LYMPHS PCT: 33 %
MCH: 30 pg (ref 26.0–34.0)
MCHC: 33.5 g/dL (ref 32.0–36.0)
MCV: 89.5 fL (ref 80.0–100.0)
Monocytes Absolute: 0.7 10*3/uL (ref 0.2–0.9)
Monocytes Relative: 10 %
NEUTROS PCT: 55 %
Neutro Abs: 3.8 10*3/uL (ref 1.4–6.5)
Platelets: 207 10*3/uL (ref 150–440)
RBC: 3.8 MIL/uL (ref 3.80–5.20)
RDW: 14.6 % — ABNORMAL HIGH (ref 11.5–14.5)
WBC: 7 10*3/uL (ref 3.6–11.0)

## 2016-11-19 LAB — COMPREHENSIVE METABOLIC PANEL
ALT: 12 U/L — AB (ref 14–54)
AST: 19 U/L (ref 15–41)
Albumin: 3.9 g/dL (ref 3.5–5.0)
Alkaline Phosphatase: 92 U/L (ref 38–126)
Anion gap: 10 (ref 5–15)
BILIRUBIN TOTAL: 0.7 mg/dL (ref 0.3–1.2)
BUN: 11 mg/dL (ref 6–20)
CALCIUM: 9.2 mg/dL (ref 8.9–10.3)
CO2: 28 mmol/L (ref 22–32)
CREATININE: 0.63 mg/dL (ref 0.44–1.00)
Chloride: 102 mmol/L (ref 101–111)
Glucose, Bld: 126 mg/dL — ABNORMAL HIGH (ref 65–99)
Potassium: 3.3 mmol/L — ABNORMAL LOW (ref 3.5–5.1)
Sodium: 140 mmol/L (ref 135–145)
Total Protein: 6.7 g/dL (ref 6.5–8.1)

## 2016-11-19 NOTE — Progress Notes (Signed)
Location:      Place of Service:  [facility] (13) Provider:  Lorenso Quarry, NP-C  Lauro Regulus, MD  Patient Care Team: Lauro Regulus, MD as PCP - General (Internal Medicine)  Extended Emergency Contact Information Primary Emergency Contact: Rasberry,Dr.Richard Address: [address]          [place], [place] [address] Darden [place] of [place] Home Phone: [phone] Mobile Phone: [phone] Relation: Spouse Secondary Emergency Contact: Cowsert,Dr.David Address: [address] [place], [place] [address] D[place] of [place] Home Phone: [phone] Relation: Son  Code Status:  dnr Goals of care: Advanced Directive information Advanced Directives 09/18/2016  Does Patient Have a Medical Advance Directive? No  Type of Advance Directive -  Does patient want to make changes to medical advance directive? No - Patient declined  Copy of Healthcare Power of Attorney in Chart? -  Would patient like information on creating a medical advance directive? No - Patient declined     Chief Complaint  Patient presents with  . Acute Visit    HPI:  Linda Elliott is a 83 y.o. female seen today for an acute visit for edema of B-lower legs. Staff concerned over increased edema of BLE. Res sits in her chair a lot during the day with the legs in the dependent position. The temperature has also been more warm and humid as of late. Linda Elliott denies chest pain or shortness of breath. Says she feels well. Linda Elliott denies n/v/d/f/c/cp/sob/ha/abd pain/dizziness/cough. Linda Elliott with dementia, unable to get complete ROS. VSS   Past Medical History:  Diagnosis Date  . Dementia   . Diverticulitis   . Glaucoma   . UTI (urinary tract infection)    Past Surgical History:  Procedure Laterality Date  . cholecytectomy    . EYE SURGERY      Allergies  Allergen Reactions  . Penicillins Anaphylaxis    All cillins Has patient had a PCN reaction causing immediate rash, facial/tongue/throat swelling, SOB or  lightheadedness with hypotension: Yes Has patient had a PCN reaction causing severe rash involving mucus membranes or skin necrosis: No Has patient had a PCN reaction that required hospitalization Yes Has patient had a PCN reaction occurring within the last 10 years: No If all of the above answers are "NO", then may proceed with Cephalosporin use.     Allergies as of 10/27/2016      Reactions   Penicillins Anaphylaxis   All cillins Has patient had a PCN reaction causing immediate rash, facial/tongue/throat swelling, SOB or lightheadedness with hypotension: Yes Has patient had a PCN reaction causing severe rash involving mucus membranes or skin necrosis: No Has patient had a PCN reaction that required hospitalization Yes Has patient had a PCN reaction occurring within the last 10 years: No If all of the above answers are "NO", then may proceed with Cephalosporin use.      Medication List       Accurate as of 10/27/16 11:59 PM. Always use your most recent med list.          alum & mag hydroxide-simeth 200-200-20 MG/5ML suspension Commonly known as:  MAALOX/MYLANTA Take 30 mLs by mouth every 4 (four) hours as needed for indigestion or heartburn.   bimatoprost 0.01 % Soln Commonly known as:  LUMIGAN Place 1 drop into the left eye at bedtime.   brimonidine 0.2 % ophthalmic solution Commonly known as:  ALPHAGAN Place into the left eye 2 (two) times daily.  dicyclomine 20 MG tablet Commonly known as:  BENTYL Take 20 mg by mouth every 6 (six) hours.   donepezil 5 MG tablet Commonly known as:  ARICEPT Take 5 mg by mouth at bedtime.   dorzolamide-timolol 22.3-6.8 MG/ML ophthalmic solution Commonly known as:  COSOPT Place 1 drop into the left eye 2 (two) times daily.   hydrALAZINE 10 MG tablet Commonly known as:  APRESOLINE Take 1 tablet (10 mg total) by mouth 3 (three) times daily as needed (prn blood pressure greater than 180/90).   LamoTRIgine 50 MG Tbdp Take 50-100 mg  by mouth 2 (two) times daily. tk 50 mg qam and 100 mg qhs   LORazepam 0.5 MG tablet Commonly known as:  ATIVAN Take 0.5 mg by mouth daily. 1700   naproxen 250 MG tablet Commonly known as:  NAPROSYN Take 250 mg by mouth 2 (two) times daily with a meal.   QUEtiapine 50 MG tablet Commonly known as:  SEROQUEL Take 50 mg by mouth 2 (two) times daily.   sucralfate 1 g tablet Commonly known as:  CARAFATE Take 1 g by mouth 4 (four) times daily.   traZODone 50 MG tablet Commonly known as:  DESYREL Take 50 mg by mouth at bedtime.   vitamin B-12 1000 MCG tablet Commonly known as:  CYANOCOBALAMIN Take 1,000 mcg by mouth daily.       Review of Systems  Constitutional: Negative for activity change, appetite change, chills, diaphoresis and fever.  HENT: Negative for congestion, sneezing, sore throat, trouble swallowing and voice change.   Respiratory: Negative for apnea, cough, choking, chest tightness, shortness of breath and wheezing.   Cardiovascular: Positive for leg swelling. Negative for chest pain and palpitations.  Gastrointestinal: Negative for abdominal distention, abdominal pain, constipation, diarrhea and nausea.  Genitourinary: Negative for difficulty urinating, dysuria, frequency and urgency.  Musculoskeletal: Negative for back pain, gait problem and myalgias. Arthralgias: typical arthritis.  Skin: Negative for color change, pallor, rash and wound.  Neurological: Negative for dizziness, tremors, syncope, speech difficulty, weakness, numbness and headaches.  Psychiatric/Behavioral: Positive for agitation, behavioral problems and hallucinations.  All other systems reviewed and are negative.    There is no immunization history on file for this patient. Pertinent  Health Maintenance Due  Topic Date Due  . DEXA SCAN  06/20/1990  . PNA vac Low Risk Adult (1 of 2 - PCV13) 06/20/1990  . INFLUENZA VACCINE  02/04/2017   No flowsheet data found. Functional Status Survey:     Vitals:   10/27/16 1000  BP: (!) 168/84  Pulse: 73   There is no height or weight on file to calculate BMI. Physical Exam  Constitutional: She is oriented to person, place, and time. Vital signs are normal. She appears well-developed and well-nourished. She is active and cooperative. She does not appear ill. No distress.  HENT:  Head: Normocephalic and atraumatic.  Mouth/Throat: Uvula is midline, oropharynx is clear and moist and mucous membranes are normal. Mucous membranes are not pale, not dry and not cyanotic.  Eyes: Conjunctivae, EOM and lids are normal. Pupils are equal, round, and reactive to light.  Neck: Trachea normal, normal range of motion and full passive range of motion without pain. Neck supple. No JVD present. No tracheal deviation, no edema and no erythema present. No thyromegaly present.  Cardiovascular: Normal rate, regular rhythm, normal heart sounds, intact distal pulses and normal pulses.  Exam reveals no gallop, no distant heart sounds and no friction rub.   No murmur heard.  2+ BLE- pitting edema  Pulmonary/Chest: Effort normal and breath sounds normal. No accessory muscle usage. No respiratory distress. She has no decreased breath sounds. She has no wheezes. She has no rhonchi. She has no rales. She exhibits no tenderness.  Abdominal: Normal appearance and bowel sounds are normal. She exhibits no distension and no ascites. There is no tenderness.  Musculoskeletal: Normal range of motion. She exhibits no edema or tenderness.  Expected osteoarthritis, stiffness  Neurological: She is alert and oriented to person, place, and time. She has normal strength.  Skin: Skin is warm, dry and intact. No rash noted. She is not diaphoretic. No cyanosis or erythema. No pallor. Nails show no clubbing.  Psychiatric: Her mood appears anxious. Her affect is angry. Her speech is tangential. She is agitated, aggressive, actively hallucinating and combative. Thought content is delusional.  Cognition and memory are impaired. She expresses impulsivity and inappropriate judgment. She exhibits abnormal recent memory.  Nursing note and vitals reviewed.   Labs reviewed:  Recent Labs  09/18/16 0103 09/18/16 0716 10/21/16 2046  NA 137 140 137  K 3.8 3.7 4.1  CL 102 105 102  CO2 28 28 28   GLUCOSE 109* 105* 132*  BUN 13 11 12   CREATININE 0.74 0.54 0.87  CALCIUM 8.7* 8.9 8.9    Recent Labs  08/28/16 1930 09/18/16 0103 10/21/16 2046  AST 26 21 25   ALT 17 13* 8*  ALKPHOS 65 72 91  BILITOT 0.8 0.5 0.7  PROT 6.8 6.5 7.0  ALBUMIN 3.7 3.8 4.0    Recent Labs  08/28/16 1930 09/18/16 0103 09/18/16 0716 10/21/16 2046  WBC 7.1 7.5 6.9 7.8  NEUTROABS 3.4 4.2  --  4.3  HGB 11.9* 11.6* 12.7 12.1  HCT 35.5 35.2 37.7 36.6  MCV 92.2 91.1 92.7 90.9  PLT 214 188 191 202   Lab Results  Component Value Date   TSH 1.518 07/10/2016   No results found for: HGBA1C Lab Results  Component Value Date   CHOL 209 (H) 10/12/2012   HDL 58 10/12/2012   LDLCALC 131 (H) 10/12/2012   TRIG 100 10/12/2012    Significant Diagnostic Results in last 30 days:  Dg Chest 1 View  Result Date: 10/21/2016 CLINICAL DATA:  Unwitnessed fall. Occipital trauma. History of dementia. Nonsmoker. EXAM: CHEST 1 VIEW COMPARISON:  09/18/2016 FINDINGS: Shallow inspiration with mild linear atelectasis in the left lung base. Azygos lobe. No focal consolidation or airspace disease. No blunting of costophrenic angles. No pneumothorax. Calcified and tortuous aorta. Degenerative changes in the spine and shoulders. IMPRESSION: Shallow inspiration with linear atelectasis in the left lung base. Electronically Signed   By: Burman Nieves M.D.   On: 10/21/2016 21:29   Ct Head Wo Contrast  Result Date: 10/21/2016 CLINICAL DATA:  Unwitnessed fall. Loss of consciousness. Back of head hurts. EXAM: CT HEAD WITHOUT CONTRAST CT CERVICAL SPINE WITHOUT CONTRAST TECHNIQUE: Multidetector CT imaging of the head and cervical  spine was performed following the standard protocol without intravenous contrast. Multiplanar CT image reconstructions of the cervical spine were also generated. COMPARISON:  09/18/2016 FINDINGS: CT HEAD FINDINGS Brain: Diffuse cerebral atrophy. Ventricular dilatation consistent with central atrophy. Low-attenuation changes in the deep white matter consistent with small vessel ischemia. No mass effect or midline shift. No abnormal extra-axial fluid collections. Gray-white matter junctions are distinct. Basal cisterns are not effaced. No acute intracranial hemorrhage. Vascular: Vascular calcifications are present. 6 mm bulbous prominence of the left internal carotid bifurcation. This may just be due  to tortuous vascularity but intracranial aneurysm is not excluded. Consider follow-up with CT angiography or MR angiography for correlation. No acute hemorrhage. Skull: The calvarium appears intact.  No depressed skull fractures. Sinuses/Orbits: Postoperative changes in the globes. Paranasal sinuses and mastoid air cells are clear. Other: None. CT CERVICAL SPINE FINDINGS Alignment: Normal alignment of the cervical vertebrae and facet joints. C1-2 articulation appears intact. Skull base and vertebrae: Skullbase is unremarkable. No vertebral compression deformities. No focal bone lesion or bone destruction. Bone cortex appears intact. Subcortical degenerative changes at the superior endplates at C5, C6, and C7. Soft tissues and spinal canal: No prevertebral fluid or swelling. No visible canal hematoma. Disc levels: Degenerative changes throughout the cervical spine with narrowed cervical interspaces and endplate hypertrophic changes throughout. Degenerative changes throughout the facet joints. Upper chest: Azygos lobe.  Lung apices are clear. Other: Vascular calcifications in the cervical carotid arteries. IMPRESSION: No acute intracranial abnormalities. Chronic atrophy and small vessel ischemic changes. 6 mm bulbous  prominence of the left internal carotid bifurcation. This could be due to tortuous vascularity but intracranial aneurysm is not excluded. Consider follow-up with CT angiography or MR angiography for correlation. No acute hemorrhage. Normal alignment of the cervical spine. Diffuse degenerative changes. No acute displaced fractures identified. Electronically Signed   By: Burman NievesWilliam  Stevens M.D.   On: 10/21/2016 22:01   Ct Cervical Spine Wo Contrast  Result Date: 10/21/2016 CLINICAL DATA:  Unwitnessed fall today. EXAM: CT HEAD WITHOUT CONTRAST CT CERVICAL SPINE WITHOUT CONTRAST TECHNIQUE: Multidetector CT imaging of the head and cervical spine was performed following the standard protocol without intravenous contrast. Multiplanar CT image reconstructions of the cervical spine were also generated. COMPARISON:  09/18/2016 FINDINGS: CT HEAD FINDINGS Brain: Ventricles, cisterns and other CSF spaces are within normal. There is moderate chronic ischemic microvascular disease. Possible small old focal infarct of the right cerebellar hemisphere. No evidence of focal mass, mass effect, shift of midline structures or acute hemorrhage. No evidence of acute infarction. Vascular: Moderate calcified plaque over the cavernous segment of the internal carotid arteries. Skull: Within normal. Sinuses/Orbits: Unchanged. Other: Minimal soft tissue swelling over the right supraorbital region. CT CERVICAL SPINE FINDINGS Alignment: Within normal. Skull base and vertebrae: Vertebral body heights are normal. There is moderate spondylosis throughout the cervical spine. Atlantoaxial articulation is within normal. There is moderate uncovertebral joint spurring and facet arthropathy. There is significant bilateral neural foraminal narrowing at multiple levels due to adjacent bony spurring. No evidence of acute fracture. Soft tissues and spinal canal: Prevertebral soft tissues are normal. Spinal canal is within normal. Disc levels: Multilevel disc  space narrowing from the C3-4 level to the C6-7 level. Upper chest: Mild calcified plaque at the carotid bifurcations. Other: None. IMPRESSION: No acute intracranial findings. Chronic ischemic microvascular disease. Minimal soft tissue swelling over the right supraorbital region. No acute cervical spine injury. Moderate spondylosis throughout the cervical spine with multilevel disc disease and significant multilevel bilateral neural foraminal narrowing. Electronically Signed   By: Elberta Fortisaniel  Boyle M.D.   On: 10/21/2016 21:52    Assessment/Plan 1. Localized edema  TED hose- on in the early am, off at night  Elevate legs when at rest  Family/ staff Communication:   Total Time:  Documentation:  Face to Face:  Family/Phone:   Labs/tests ordered:    Medication list reviewed and assessed for continued appropriateness.  Brynda RimShannon H. Melicia Esqueda, NP-C Geriatrics [facility] [phone] [address] [place], [place] [address] Cell Phone (Mon-Fri  8am-5pm):  [phone] On Call:  [phone] & follow prompts after 5pm & weekends Office Phone:  [phone] Office Fax:  [phone]

## 2016-11-20 ENCOUNTER — Non-Acute Institutional Stay (SKILLED_NURSING_FACILITY): Payer: Medicare HMO | Admitting: Gerontology

## 2016-11-20 DIAGNOSIS — F0391 Unspecified dementia with behavioral disturbance: Secondary | ICD-10-CM

## 2016-11-20 DIAGNOSIS — W19XXXD Unspecified fall, subsequent encounter: Secondary | ICD-10-CM

## 2016-11-23 NOTE — Addendum Note (Signed)
Addended by: Lorenso QuarryLEACH, Willam Munford H on: 11/23/2016 09:31 PM   Modules accepted: Level of Service

## 2016-11-23 NOTE — Progress Notes (Addendum)
Location:      Place of Service:  SNF (31) Provider:  Lorenso Quarry, NP-C  Lauro Regulus, MD  Patient Care Team: Lauro Regulus, MD as PCP - General (Internal Medicine)  Extended Emergency Contact Information Primary Emergency Contact: Rehmann,Dr.Richard Address: [address]          [place], [place] [address] Darden [place] of [place] Home Phone: [phone] Mobile Phone: [phone] Relation: Spouse Secondary Emergency Contact: Landin,Dr.David Address: [address] [place], [place] [address] D[place] of [place] Home Phone: [phone] Relation: Son  Code Status:  DNR Goals of care: Advanced Directive information Advanced Directives 09/18/2016  Does Patient Have a Medical Advance Directive? No  Type of Advance Directive -  Does patient want to make changes to medical advance directive? No - Patient declined  Copy of Healthcare Power of Attorney in Chart? -  Would patient like information on creating a medical advance directive? No - Patient declined     Chief Complaint  Patient presents with  . Acute Visit    HPI:  Pt is a 83 y.o. female seen today for an acute visit for fall. Was called urgently to the pt's room. She had gotten up unassisted again and fell. She hit the posterior scalp on the corner of a table in her room. She has an ~ 3 cm superficial laceration/abrasion on her head. Area is bleeding, but bleeding has decreased as nursing has been applying pressure to the wound. Pt is lying in bed with her eyes closed. She is alert and confused at baseline. Laceration very shallow, clean. No foreign bodies. Hematoma developing beneath the laceration. Nursing is applying ice. Pt neuro checks are negative- at baseline. PERRL. Pt c/o h/a. No vision changes, no c/o n/v/d/f/c/cp/sob/abd pain. Denies dizziness. Pt's husband has requested pt not be sent out to the hospital anymore. Will have nursing notify husband of fall and injury. Will continue  to monitor. Pt safety is a huge challenge with this pt as she is end stage dementia, unable to retain instructions to call before getting up. She also will wait until staff has left the room and will attempt activities independently. She has frequent falls and is combative and resistant to assistance many times. Nursing continues to round on pt frequently. VSS. No other complaints.    Past Medical History:  Diagnosis Date  . Dementia   . Diverticulitis   . Glaucoma   . UTI (urinary tract infection)    Past Surgical History:  Procedure Laterality Date  . cholecytectomy    . EYE SURGERY      Allergies  Allergen Reactions  . Penicillins Anaphylaxis    All cillins Has patient had a PCN reaction causing immediate rash, facial/tongue/throat swelling, SOB or lightheadedness with hypotension: Yes Has patient had a PCN reaction causing severe rash involving mucus membranes or skin necrosis: No Has patient had a PCN reaction that required hospitalization Yes Has patient had a PCN reaction occurring within the last 10 years: No If all of the above answers are "NO", then may proceed with Cephalosporin use.     Allergies as of 11/13/2016      Reactions   Penicillins Anaphylaxis   All cillins Has patient had a PCN reaction causing immediate rash, facial/tongue/throat swelling, SOB or lightheadedness with hypotension: Yes Has patient had a PCN reaction causing severe rash involving mucus membranes or skin necrosis: No Has patient had a  PCN reaction that required hospitalization Yes Has patient had a PCN reaction occurring within the last 10 years: No If all of the above answers are "NO", then may proceed with Cephalosporin use.      Medication List       Accurate as of 11/13/16 11:59 PM. Always use your most recent med list.          alum & mag hydroxide-simeth 200-200-20 MG/5ML suspension Commonly known as:  MAALOX/MYLANTA Take 30 mLs by mouth every 4 (four) hours as needed for  indigestion or heartburn.   bimatoprost 0.01 % Soln Commonly known as:  LUMIGAN Place 1 drop into the left eye at bedtime.   brimonidine 0.2 % ophthalmic solution Commonly known as:  ALPHAGAN Place into the left eye 2 (two) times daily.   dicyclomine 20 MG tablet Commonly known as:  BENTYL Take 20 mg by mouth every 6 (six) hours.   donepezil 5 MG tablet Commonly known as:  ARICEPT Take 5 mg by mouth at bedtime.   dorzolamide-timolol 22.3-6.8 MG/ML ophthalmic solution Commonly known as:  COSOPT Place 1 drop into the left eye 2 (two) times daily.   hydrALAZINE 10 MG tablet Commonly known as:  APRESOLINE Take 1 tablet (10 mg total) by mouth 3 (three) times daily as needed (prn blood pressure greater than 180/90).   LamoTRIgine 50 MG Tbdp Take 50-100 mg by mouth 2 (two) times daily. tk 50 mg qam and 100 mg qhs   LORazepam 0.5 MG tablet Commonly known as:  ATIVAN Take 0.5 mg by mouth daily. 1700   naproxen 250 MG tablet Commonly known as:  NAPROSYN Take 250 mg by mouth 2 (two) times daily with a meal.   QUEtiapine 50 MG tablet Commonly known as:  SEROQUEL Take 50 mg by mouth 2 (two) times daily.   sucralfate 1 g tablet Commonly known as:  CARAFATE Take 1 g by mouth 4 (four) times daily.   traZODone 50 MG tablet Commonly known as:  DESYREL Take 50 mg by mouth at bedtime.   vitamin B-12 1000 MCG tablet Commonly known as:  CYANOCOBALAMIN Take 1,000 mcg by mouth daily.       Review of Systems  Unable to perform ROS: Dementia  Constitutional: Negative for activity change, appetite change, chills, diaphoresis and fever.  HENT: Negative for congestion, sneezing, sore throat, trouble swallowing and voice change.   Respiratory: Negative for apnea, cough, choking, chest tightness, shortness of breath and wheezing.   Cardiovascular: Negative for chest pain, palpitations and leg swelling.  Gastrointestinal: Negative for abdominal distention, abdominal pain, constipation,  diarrhea and nausea.  Genitourinary: Negative for difficulty urinating, dysuria, frequency and urgency.  Musculoskeletal: Negative for back pain, gait problem and myalgias. Arthralgias: typical arthritis.  Skin: Positive for wound. Negative for color change, pallor and rash.  Neurological: Positive for headaches. Negative for dizziness, tremors, syncope, speech difficulty, weakness and numbness.  Psychiatric/Behavioral: Positive for agitation, behavioral problems and hallucinations.  All other systems reviewed and are negative.    There is no immunization history on file for this patient. Pertinent  Health Maintenance Due  Topic Date Due  . DEXA SCAN  06/20/1990  . PNA vac Low Risk Adult (1 of 2 - PCV13) 06/20/1990  . INFLUENZA VACCINE  02/04/2017   No flowsheet data found. Functional Status Survey:    Vitals:   11/13/16 1420  BP: 128/70  Pulse: 70  Resp: 18  Temp: 97.8 F (36.6 C)  SpO2: 95%   There  is no height or weight on file to calculate BMI. Physical Exam  Constitutional: She is oriented to person, place, and time. Vital signs are normal. She appears well-developed and well-nourished. She is active and cooperative. She does not appear ill. No distress.  HENT:  Head: Normocephalic and atraumatic.  Mouth/Throat: Uvula is midline, oropharynx is clear and moist and mucous membranes are normal. Mucous membranes are not pale, not dry and not cyanotic.  Eyes: Conjunctivae, EOM and lids are normal. Pupils are equal, round, and reactive to light.  Neck: Trachea normal, normal range of motion and full passive range of motion without pain. Neck supple. No JVD present. No tracheal deviation, no edema and no erythema present. No thyromegaly present.  Cardiovascular: Normal rate, regular rhythm, normal heart sounds, intact distal pulses and normal pulses.  Exam reveals no gallop, no distant heart sounds and no friction rub.   No murmur heard. 2+ BLE- pitting edema  Pulmonary/Chest:  Effort normal and breath sounds normal. No accessory muscle usage. No respiratory distress. She has no decreased breath sounds. She has no wheezes. She has no rhonchi. She has no rales. She exhibits no tenderness.  Abdominal: Normal appearance and bowel sounds are normal. She exhibits no distension and no ascites. There is no tenderness.  Musculoskeletal: Normal range of motion. She exhibits no edema or tenderness.  Expected osteoarthritis, stiffness  Neurological: She is alert and oriented to person, place, and time. She has normal strength.  Skin: Skin is warm and dry. Laceration (posterior scalp) noted. No rash noted. She is not diaphoretic. No cyanosis or erythema. No pallor. Nails show no clubbing.     Psychiatric: Her mood appears anxious. Her affect is angry. Her speech is tangential. She is agitated, aggressive, actively hallucinating and combative. Thought content is delusional. Cognition and memory are impaired. She expresses impulsivity and inappropriate judgment. She exhibits abnormal recent memory.  Nursing note and vitals reviewed.   Labs reviewed:  Recent Labs  09/18/16 0716 10/21/16 2046 11/19/16 1846  NA 140 137 140  K 3.7 4.1 3.3*  CL 105 102 102  CO2 28 28 28   GLUCOSE 105* 132* 126*  BUN 11 12 11   CREATININE 0.54 0.87 0.63  CALCIUM 8.9 8.9 9.2    Recent Labs  09/18/16 0103 10/21/16 2046 11/19/16 1846  AST 21 25 19   ALT 13* 8* 12*  ALKPHOS 72 91 92  BILITOT 0.5 0.7 0.7  PROT 6.5 7.0 6.7  ALBUMIN 3.8 4.0 3.9    Recent Labs  09/18/16 0103 09/18/16 0716 10/21/16 2046 11/19/16 1846  WBC 7.5 6.9 7.8 7.0  NEUTROABS 4.2  --  4.3 3.8  HGB 11.6* 12.7 12.1 11.4*  HCT 35.2 37.7 36.6 34.0*  MCV 91.1 92.7 90.9 89.5  PLT 188 191 202 207   Lab Results  Component Value Date   TSH 1.518 07/10/2016   No results found for: HGBA1C Lab Results  Component Value Date   CHOL 209 (H) 10/12/2012   HDL 58 10/12/2012   LDLCALC 131 (H) 10/12/2012   TRIG 100  10/12/2012    Significant Diagnostic Results in last 30 days:  No results found.  Assessment/Plan 1. Laceration of scalp without foreign body, initial encounter  Clean with NS  Apply ice to area as long as pt will tolerate  2. Fall, initial encounter  Fall precautions  Safety precautions  Call bell in reach  Family/ staff Communication:   Total Time:  Documentation:  Face to Face:  Family/Phone:  Labs/tests ordered:    Medication list reviewed and assessed for continued appropriateness.  Vikki Ports, NP-C Geriatrics [facility] [phone], [place] [address] Cell Phone (Mon-Fri 8am-5pm):  [phone] On Call:  [phone] & follow prompts after 5pm & weekends Office Phone:  [phone] Office Fax:  [phone]

## 2016-11-23 NOTE — Progress Notes (Signed)
Interdisciplinary Goals of Care Family Meeting    Date carried out:: 11/20/2016  Location of the meeting: NP office  Member's involved: Linda Minsichard Ging- husband; [name], Palliative Care, NP; [name]- accounting; [name], RN- [facility] Manager; [name], NP plus phone consultation with [name], NP [facility]  Durable Power of Attorney or acting medical decision maker: Linda Elliott    Discussion: We discussed goals of care for Linda Elliott .  We had extensive conversations regarding pt's worsening behaviors, combativeness, resistance to care and especially concern for her safety as she is falling almost daily. Pt with end stage dementia, unable to retain instructions to call for assistance. We discussed her previous occupation and the fact she was used to being the one giving commands and others listening. Now, she has strangers giving her commands and she is resistant to this. We discussed her routine, schedule when she was working. We discussed the options for residency on the different units along with the costs involved, pro/cons, etc. We discussed some of the issues with the staff/staffing ratios, availability, use of supplies. We further discussed husbands wishes for no hospitalization. We discussed the spiritual aspects of the situation. She again discussed the roles of each member of the meeting. We discussed the role of Palliative Medicine as well as the role of Hospice. We discussed why she did not qualify for Hospice at this time and the criteria involved for her to qualify for services. We again had extensive discussion regarding her medication regimen, balancing keeping her and the staff and other residents safe but not oversedating. Discussed finding the right balance, etc. At the end of the meeting, [name] and I conference called [name], NP with [facility]. Isaiah SergeSondra has seen the pt and made medication  recommendations in the past. We discussed the current situation and established a plan for a new medication regimen. Isaiah SergeSondra will be at the facility next Tuesday. She will then see the pt and evaluate the effectiveness of the new medication regimen. Husband was very grateful for the meeting and verbalized he is "open" to try anything that might help the pt. Including medication changes, etc.    Code status: DNR  Disposition: Reduce Lorazepam to 0.25 mg po TID, then 0.5 mg Q HS; Continue Seroquel 50 mg po TID through the weekend; Start Risperidone 0.25 mg po Q HS; DC Trazodone. [name] to f/u on Tuesday  Time spent for the meeting: 1 hour, 45 minutes plus 20 minutes consulting with Psychiatry  [facility] 11/20/2016 12:10 PM  [id number]}

## 2016-11-25 NOTE — Addendum Note (Signed)
Addended by: Lorenso QuarryLEACH, Gavan Nordby H on: 11/25/2016 04:29 PM   Modules accepted: Level of Service

## 2016-12-05 ENCOUNTER — Encounter
Admission: RE | Admit: 2016-12-05 | Discharge: 2016-12-05 | Disposition: A | Discharge status: Home / Self Care | Payer: Medicare HMO | Source: Ambulatory Visit | Attending: Internal Medicine | Admitting: Internal Medicine

## 2016-12-05 DIAGNOSIS — F0281 Dementia in other diseases classified elsewhere with behavioral disturbance: Secondary | ICD-10-CM | POA: Insufficient documentation

## 2016-12-09 DIAGNOSIS — F0281 Dementia in other diseases classified elsewhere with behavioral disturbance: Secondary | ICD-10-CM | POA: Diagnosis not present

## 2016-12-09 LAB — COMPREHENSIVE METABOLIC PANEL
ALT: 10 U/L — ABNORMAL LOW (ref 14–54)
ANION GAP: 8 (ref 5–15)
AST: 16 U/L (ref 15–41)
Albumin: 4.1 g/dL (ref 3.5–5.0)
Alkaline Phosphatase: 89 U/L (ref 38–126)
BUN: 11 mg/dL (ref 6–20)
CHLORIDE: 102 mmol/L (ref 101–111)
CO2: 30 mmol/L (ref 22–32)
Calcium: 9.1 mg/dL (ref 8.9–10.3)
Creatinine, Ser: 0.67 mg/dL (ref 0.44–1.00)
Glucose, Bld: 119 mg/dL — ABNORMAL HIGH (ref 65–99)
POTASSIUM: 3.3 mmol/L — AB (ref 3.5–5.1)
Sodium: 140 mmol/L (ref 135–145)
Total Bilirubin: 0.9 mg/dL (ref 0.3–1.2)
Total Protein: 6.6 g/dL (ref 6.5–8.1)

## 2016-12-09 LAB — CBC WITH DIFFERENTIAL/PLATELET
BASOS ABS: 0 10*3/uL (ref 0–0.1)
Basophils Relative: 1 %
EOS PCT: 2 %
Eosinophils Absolute: 0.1 10*3/uL (ref 0–0.7)
HCT: 35.5 % (ref 35.0–47.0)
Hemoglobin: 11.9 g/dL — ABNORMAL LOW (ref 12.0–16.0)
LYMPHS PCT: 40 %
Lymphs Abs: 2.2 10*3/uL (ref 1.0–3.6)
MCH: 30.6 pg (ref 26.0–34.0)
MCHC: 33.5 g/dL (ref 32.0–36.0)
MCV: 91.4 fL (ref 80.0–100.0)
MONO ABS: 0.6 10*3/uL (ref 0.2–0.9)
Monocytes Relative: 11 %
Neutro Abs: 2.5 10*3/uL (ref 1.4–6.5)
Neutrophils Relative %: 46 %
PLATELETS: 208 10*3/uL (ref 150–440)
RBC: 3.89 MIL/uL (ref 3.80–5.20)
RDW: 14.7 % — AB (ref 11.5–14.5)
WBC: 5.4 10*3/uL (ref 3.6–11.0)

## 2016-12-09 LAB — AMYLASE: AMYLASE: 18 U/L — AB (ref 28–100)

## 2016-12-09 LAB — LIPASE, BLOOD: LIPASE: 17 U/L (ref 11–51)

## 2016-12-10 ENCOUNTER — Non-Acute Institutional Stay (SKILLED_NURSING_FACILITY): Payer: Medicare HMO | Admitting: Gerontology

## 2016-12-10 DIAGNOSIS — F0391 Unspecified dementia with behavioral disturbance: Secondary | ICD-10-CM

## 2016-12-15 ENCOUNTER — Non-Acute Institutional Stay (SKILLED_NURSING_FACILITY): Payer: Medicare HMO | Admitting: Gerontology

## 2016-12-15 DIAGNOSIS — R296 Repeated falls: Secondary | ICD-10-CM

## 2016-12-15 DIAGNOSIS — F0391 Unspecified dementia with behavioral disturbance: Secondary | ICD-10-CM | POA: Diagnosis not present

## 2016-12-16 ENCOUNTER — Non-Acute Institutional Stay (SKILLED_NURSING_FACILITY): Payer: Medicare HMO | Admitting: Gerontology

## 2016-12-16 DIAGNOSIS — I639 Cerebral infarction, unspecified: Secondary | ICD-10-CM | POA: Diagnosis not present

## 2016-12-16 DIAGNOSIS — Z515 Encounter for palliative care: Secondary | ICD-10-CM | POA: Diagnosis not present

## 2016-12-18 ENCOUNTER — Non-Acute Institutional Stay (SKILLED_NURSING_FACILITY): Payer: Medicare HMO | Admitting: Gerontology

## 2016-12-18 DIAGNOSIS — I639 Cerebral infarction, unspecified: Secondary | ICD-10-CM

## 2016-12-18 DIAGNOSIS — Z515 Encounter for palliative care: Secondary | ICD-10-CM | POA: Diagnosis not present

## 2016-12-19 ENCOUNTER — Non-Acute Institutional Stay (SKILLED_NURSING_FACILITY): Payer: Medicare HMO | Admitting: Gerontology

## 2016-12-19 DIAGNOSIS — I639 Cerebral infarction, unspecified: Secondary | ICD-10-CM

## 2016-12-19 DIAGNOSIS — R296 Repeated falls: Secondary | ICD-10-CM

## 2016-12-19 DIAGNOSIS — Z515 Encounter for palliative care: Secondary | ICD-10-CM | POA: Diagnosis not present

## 2016-12-19 DIAGNOSIS — F0391 Unspecified dementia with behavioral disturbance: Secondary | ICD-10-CM

## 2016-12-22 DIAGNOSIS — I639 Cerebral infarction, unspecified: Secondary | ICD-10-CM | POA: Insufficient documentation

## 2016-12-22 NOTE — Progress Notes (Signed)
Location:      Place of Service:  SNF (31) Provider:  Lorenso Quarry, NP-C  Lauro Regulus, MD  Patient Care Team: Lauro Regulus, MD as PCP - General (Internal Medicine)  Extended Emergency Contact Information Primary Emergency Contact: Shaul,Dr.Richard Address: [address]          [place], [place] [address] [place] of [place] Home Phone: [phone] Mobile Phone: [phone] Relation: Spouse Secondary Emergency Contact: Dettore,Dr.David Address: [address] [place], [place] [address] [place] of [place] Home Phone: [phone] Relation: Son  Code Status:  DNR Goals of care: Advanced Directive information Advanced Directives 09/18/2016  Does Patient Have a Medical Advance Directive? No  Type of Advance Directive -  Does patient want to make changes to medical advance directive? No - Patient declined  Copy of Healthcare Power of Attorney in Chart? -  Would patient like information on creating a medical advance directive? No - Patient declined     Chief Complaint  Patient presents with  . Acute Visit    HPI:  Pt is a 83 y.o. female seen today for an acute visit for sudden change in LOC. I was called urgently to the room by nursing staff. Pt was in the bathroom when she lost consciousness. Staff had to return her to bed by using the [facility] lift. Pt was completely unresponsive during the transition. Earlier today, pt was eating and interacting with her family. She had requested verbally to use the bathroom. Once staff had her back in bed, neurochecks assessed. Pt's pupils were responsive to light, but minimally. Eyes had lateral nystagmus. Non-focal. Pt remained unresponsive to tactile and verbal stimuli. Pt's skin had a gray tinting. Pulse was weak, shallow respirations. Manual B/P 140/70, P- 64. Husband re-iterates he does not want pt to go to the hospital. He was ready to let her go. Pt is a DNR. Comfort meds initiated. Referral to Hospice  made. Will monitor closely for needs.    Past Medical History:  Diagnosis Date  . Dementia   . Diverticulitis   . Glaucoma   . UTI (urinary tract infection)    Past Surgical History:  Procedure Laterality Date  . cholecytectomy    . EYE SURGERY      Allergies  Allergen Reactions  . Penicillins Anaphylaxis    All cillins Has patient had a PCN reaction causing immediate rash, facial/tongue/throat swelling, SOB or lightheadedness with hypotension: Yes Has patient had a PCN reaction causing severe rash involving mucus membranes or skin necrosis: No Has patient had a PCN reaction that required hospitalization Yes Has patient had a PCN reaction occurring within the last 10 years: No If all of the above answers are "NO", then may proceed with Cephalosporin use.     Allergies as of 12/16/2016      Reactions   Penicillins Anaphylaxis   All cillins Has patient had a PCN reaction causing immediate rash, facial/tongue/throat swelling, SOB or lightheadedness with hypotension: Yes Has patient had a PCN reaction causing severe rash involving mucus membranes or skin necrosis: No Has patient had a PCN reaction that required hospitalization Yes Has patient had a PCN reaction occurring within the last 10 years: No If all of the above answers are "NO", then may proceed with Cephalosporin use.      Medication List       Accurate as of 12/16/16 11:59 PM. Always use your most recent med list.  alum & mag hydroxide-simeth 200-200-20 MG/5ML suspension Commonly known as:  MAALOX/MYLANTA Take 30 mLs by mouth every 4 (four) hours as needed for indigestion or heartburn.   bimatoprost 0.01 % Soln Commonly known as:  LUMIGAN Place 1 drop into the left eye at bedtime.   brimonidine 0.2 % ophthalmic solution Commonly known as:  ALPHAGAN Place into the left eye 2 (two) times daily.   dicyclomine 20 MG tablet Commonly known as:  BENTYL Take 20 mg by mouth every 6 (six) hours.     donepezil 5 MG tablet Commonly known as:  ARICEPT Take 5 mg by mouth at bedtime.   dorzolamide-timolol 22.3-6.8 MG/ML ophthalmic solution Commonly known as:  COSOPT Place 1 drop into the left eye 2 (two) times daily.   hydrALAZINE 10 MG tablet Commonly known as:  APRESOLINE Take 1 tablet (10 mg total) by mouth 3 (three) times daily as needed (prn blood pressure greater than 180/90).   LamoTRIgine 50 MG Tbdp Take 50-100 mg by mouth 2 (two) times daily. tk 50 mg qam and 100 mg qhs   LORazepam 0.5 MG tablet Commonly known as:  ATIVAN Take 0.5 mg by mouth daily. 1700   naproxen 250 MG tablet Commonly known as:  NAPROSYN Take 250 mg by mouth 2 (two) times daily with a meal.   QUEtiapine 50 MG tablet Commonly known as:  SEROQUEL Take 50 mg by mouth 2 (two) times daily.   sucralfate 1 g tablet Commonly known as:  CARAFATE Take 1 g by mouth 4 (four) times daily.   traZODone 50 MG tablet Commonly known as:  DESYREL Take 50 mg by mouth at bedtime.   vitamin B-12 1000 MCG tablet Commonly known as:  CYANOCOBALAMIN Take 1,000 mcg by mouth daily.       Review of Systems  Unable to perform ROS: Patient unresponsive  Constitutional: Positive for activity change. Negative for appetite change, chills, diaphoresis and fever.  Respiratory: Negative for apnea, cough, choking, chest tightness, shortness of breath and wheezing.   Cardiovascular: Negative for chest pain, palpitations and leg swelling.  Musculoskeletal: Arthralgias: typical arthritis.  Skin: Negative for color change, pallor, rash and wound.  Neurological: Positive for syncope and weakness. Negative for dizziness, tremors, facial asymmetry, speech difficulty, numbness and headaches.  Psychiatric/Behavioral: Negative for agitation, behavioral problems and confusion.  All other systems reviewed and are negative.    There is no immunization history on file for this patient. Pertinent  Health Maintenance Due  Topic Date  Due  . DEXA SCAN  06/20/1990  . PNA vac Low Risk Adult (1 of 2 - PCV13) 06/20/1990  . INFLUENZA VACCINE  02/04/2017   No flowsheet data found. Functional Status Survey:    Vitals:   12/16/16 1030  BP: 131/71  Pulse: 66  Resp: 18  Temp: 97.3 F (36.3 C)  SpO2: 95%   There is no height or weight on file to calculate BMI. Physical Exam  Constitutional: Vital signs are normal. She appears well-developed and well-nourished. She is active. She has a sickly appearance. She does not appear ill. No distress.  HENT:  Head: Normocephalic and atraumatic.  Mouth/Throat: Uvula is midline, oropharynx is clear and moist and mucous membranes are normal. Mucous membranes are not pale, not dry and not cyanotic.  Eyes: Conjunctivae and lids are normal. Pupils are equal, round, and reactive to light. Right eye exhibits abnormal extraocular motion and nystagmus. Left eye exhibits abnormal extraocular motion and nystagmus.  Neck: Trachea normal, normal range  of motion and full passive range of motion without pain. Neck supple. No JVD present. No tracheal deviation, no edema and no erythema present. No thyromegaly present.  Cardiovascular: Normal rate and intact distal pulses.  An irregular rhythm present. Exam reveals distant heart sounds. Exam reveals no gallop and no friction rub.   No murmur heard. Pulses:      Dorsalis pedis pulses are 1+ on the right side, and 1+ on the left side.  Pulmonary/Chest: Effort normal. No accessory muscle usage. No respiratory distress. She has decreased breath sounds in the right lower field and the left lower field. She has no wheezes. She has no rhonchi. She has no rales. She exhibits no tenderness.  Abdominal: Soft. Normal appearance. She exhibits no distension and no ascites. Bowel sounds are absent. There is no tenderness.  Musculoskeletal: Normal range of motion. She exhibits no edema or tenderness.  Expected osteoarthritis, stiffness  Neurological: She has normal  strength. She is unresponsive.  Skin: Skin is warm, dry and intact. No rash noted. She is not diaphoretic. There is cyanosis. No erythema. There is pallor. Nails show no clubbing.  Nursing note and vitals reviewed.   Labs reviewed:  Recent Labs  10/21/16 2046 11/19/16 1846 12/09/16 0656  NA 137 140 140  K 4.1 3.3* 3.3*  CL 102 102 102  CO2 28 28 30   GLUCOSE 132* 126* 119*  BUN 12 11 11   CREATININE 0.87 0.63 0.67  CALCIUM 8.9 9.2 9.1    Recent Labs  10/21/16 2046 11/19/16 1846 12/09/16 0656  AST 25 19 16   ALT 8* 12* 10*  ALKPHOS 91 92 89  BILITOT 0.7 0.7 0.9  PROT 7.0 6.7 6.6  ALBUMIN 4.0 3.9 4.1    Recent Labs  10/21/16 2046 11/19/16 1846 12/09/16 0656  WBC 7.8 7.0 5.4  NEUTROABS 4.3 3.8 2.5  HGB 12.1 11.4* 11.9*  HCT 36.6 34.0* 35.5  MCV 90.9 89.5 91.4  PLT 202 207 208   Lab Results  Component Value Date   TSH 1.518 07/10/2016   No results found for: HGBA1C Lab Results  Component Value Date   CHOL 209 (H) 10/12/2012   HDL 58 10/12/2012   LDLCALC 131 (H) 10/12/2012   TRIG 100 10/12/2012    Significant Diagnostic Results in last 30 days:  No results found.  Assessment/Plan 1. Cerebrovascular accident (CVA), unspecified mechanism (HCC)  No treatment  No workup  2. Encounter for dying care  DC all non-essential meds  Morphine 20 mg/ mL 0.25-0.5 mL po/SL Q 1 hour prn pain, dyspnea  Continue Clonazepam 0.5 mg po BID  Continue Lorazepam 0.5 mg po Q 4 hours prn anxiety, agitation, restlessness  Atropine 1%- 2 drops under the tongue SL Q 30 minutes prn- secretions  Hospice referral  Family/ staff Communication:   Total Time: 55 minutes  Documentation: 10 minutes  Face to Face: 45 minutes  Family/Phone:   Labs/tests ordered:    Medication list reviewed and assessed for continued appropriateness.  Brynda RimShannon H. Raylan Hanton, NP-C Geriatrics [facility] [phone] [place], [place] [id number] Cell  Phone (Mon-Fri 8am-5pm):  [phone] On Call:  [phone] & follow prompts after 5pm & weekends Office Phone:  [phone] Office Fax:  [phone]

## 2016-12-22 NOTE — Progress Notes (Signed)
Location:      Place of Service:  SNF (31) Provider:  Lorenso QuarryShannon Shamell Hittle, NP-C  Lauro RegulusAnderson, Marshall W, MD  Patient Care Team: Lauro RegulusAnderson, Marshall W, MD as PCP - General (Internal Medicine)  Extended Emergency Contact Information Primary Emergency Contact: Labombard,Dr.Richard Address: [address]          [place], [place] [address] [place] Home Phone: [phone] Mobile Phone: [phone] Relation: Spouse Secondary Emergency Contact: Fadness,Dr.David Address: [address]          [place], [place] [address] [place] Home Phone: [phone] Relation: Son  Code Status:  DNR Goals of care: Advanced Directive information Advanced Directives 09/18/2016  Does Patient Have a Medical Advance Directive? No  Type of Advance Directive -  Does patient want to make changes to medical advance directive? No - Patient declined  Copy of Healthcare Power of Attorney in Chart? -  Would patient like information on creating a medical advance directive? No - Patient declined     Chief Complaint  Patient presents with  . Follow-up    HPI:  Pt is a 83 y.o. female seen today for follow up. Pt's son and daughter are here today. They and husband are at the bedside. Pt remains unresponsive, not eating, not drinking. Pt having intermittent episodes with secretions. Lungs with faint rhonchi. Mild fever.  Appears to be having mild respiratory distress, will ask nursing to give a dose of morphine. Otherwise, pt appears comfortable. Husband and children appear to be grieving appropriately. Emotional support offered. Will continue to monitor closely for needs.       Past Medical History:  Diagnosis Date  . Dementia   . Diverticulitis   . Glaucoma   . UTI (urinary tract infection)    Past Surgical History:  Procedure Laterality Date  . cholecytectomy    . EYE SURGERY      Allergies  Allergen Reactions  . Penicillins Anaphylaxis    All cillins Has patient had a PCN  reaction causing immediate rash, facial/tongue/throat swelling, SOB or lightheadedness with hypotension: Yes Has patient had a PCN reaction causing severe rash involving mucus membranes or skin necrosis: No Has patient had a PCN reaction that required hospitalization Yes Has patient had a PCN reaction occurring within the last 10 years: No If all of the above answers are "NO", then may proceed with Cephalosporin use.     Allergies as of 12/19/2016      Reactions   Penicillins Anaphylaxis   All cillins Has patient had a PCN reaction causing immediate rash, facial/tongue/throat swelling, SOB or lightheadedness with hypotension: Yes Has patient had a PCN reaction causing severe rash involving mucus membranes or skin necrosis: No Has patient had a PCN reaction that required hospitalization Yes Has patient had a PCN reaction occurring within the last 10 years: No If all of the above answers are "NO", then may proceed with Cephalosporin use.      Medication List       Accurate as of 12/19/16 11:59 PM. Always use your most recent med list.          alum & mag hydroxide-simeth 200-200-20 MG/5ML suspension Commonly known as:  MAALOX/MYLANTA Take 30 mLs by mouth every 4 (four) hours as needed for indigestion or heartburn.   bimatoprost 0.01 % Soln Commonly known as:  LUMIGAN Place 1 drop into the left eye at bedtime.   brimonidine 0.2 % ophthalmic solution Commonly known as:  ALPHAGAN Place  into the left eye 2 (two) times daily.   dicyclomine 20 MG tablet Commonly known as:  BENTYL Take 20 mg by mouth every 6 (six) hours.   donepezil 5 MG tablet Commonly known as:  ARICEPT Take 5 mg by mouth at bedtime.   dorzolamide-timolol 22.3-6.8 MG/ML ophthalmic solution Commonly known as:  COSOPT Place 1 drop into the left eye 2 (two) times daily.   hydrALAZINE 10 MG tablet Commonly known as:  APRESOLINE Take 1 tablet (10 mg total) by mouth 3 (three) times daily as needed (prn blood  pressure greater than 180/90).   LamoTRIgine 50 MG Tbdp Take 50-100 mg by mouth 2 (two) times daily. tk 50 mg qam and 100 mg qhs   LORazepam 0.5 MG tablet Commonly known as:  ATIVAN Take 0.5 mg by mouth daily. 1700   naproxen 250 MG tablet Commonly known as:  NAPROSYN Take 250 mg by mouth 2 (two) times daily with a meal.   QUEtiapine 50 MG tablet Commonly known as:  SEROQUEL Take 50 mg by mouth 2 (two) times daily.   sucralfate 1 g tablet Commonly known as:  CARAFATE Take 1 g by mouth 4 (four) times daily.   traZODone 50 MG tablet Commonly known as:  DESYREL Take 50 mg by mouth at bedtime.   vitamin B-12 1000 MCG tablet Commonly known as:  CYANOCOBALAMIN Take 1,000 mcg by mouth daily.       Review of Systems  Unable to perform ROS: Patient unresponsive  Constitutional: Positive for activity change and fever. Negative for appetite change, chills and diaphoresis.  Respiratory: Positive for shortness of breath. Negative for apnea, cough, choking, chest tightness and wheezing.   Cardiovascular: Negative.  Negative for chest pain, palpitations and leg swelling.  Musculoskeletal: Arthralgias: typical arthritis.  Skin: Negative for color change, pallor, rash and wound.  Neurological: Negative for dizziness, tremors, syncope, facial asymmetry, speech difficulty, weakness, numbness and headaches.  Psychiatric/Behavioral: Negative for agitation, behavioral problems and confusion.  All other systems reviewed and are negative.    There is no immunization history on file for this patient. Pertinent  Health Maintenance Due  Topic Date Due  . DEXA SCAN  06/20/1990  . PNA vac Low Risk Adult (1 of 2 - PCV13) 06/20/1990  . INFLUENZA VACCINE  02/04/2017   No flowsheet data found. Functional Status Survey:    Vitals:   12/18/16 0730  BP: 140/61  Pulse: 88  Resp: 14  Temp: 98.3 F (36.8 C)  SpO2: (!) 81%   There is no height or weight on file to calculate BMI. Physical  Exam  Constitutional: Vital signs are normal. She appears well-developed and well-nourished. She is active. She has a sickly appearance. She does not appear ill. No distress.  HENT:  Head: Normocephalic and atraumatic.  Mouth/Throat: Uvula is midline, oropharynx is clear and moist and mucous membranes are normal. Mucous membranes are not pale, not dry and not cyanotic.  Eyes: Conjunctivae and lids are normal. Pupils are equal, round, and reactive to light. Right eye exhibits abnormal extraocular motion and nystagmus. Left eye exhibits abnormal extraocular motion and nystagmus.  Neck: Trachea normal, normal range of motion and full passive range of motion without pain. Neck supple. No JVD present. No tracheal deviation, no edema and no erythema present. No thyromegaly present.  Cardiovascular: Normal rate and intact distal pulses.  An irregular rhythm present. Exam reveals distant heart sounds. Exam reveals no gallop and no friction rub.   No murmur heard.  Pulses:      Dorsalis pedis pulses are 1+ on the right side, and 1+ on the left side.  Pulmonary/Chest: Effort normal. No accessory muscle usage. No respiratory distress. She has decreased breath sounds in the right lower field and the left lower field. She has no wheezes. She has no rhonchi. She has no rales. She exhibits no tenderness.  Abdominal: Soft. Normal appearance. She exhibits no distension and no ascites. Bowel sounds are absent. There is no tenderness.  Musculoskeletal: Normal range of motion. She exhibits no edema or tenderness.  Expected osteoarthritis, stiffness  Neurological: She has normal strength. She is unresponsive.  Skin: Skin is warm, dry and intact. No rash noted. She is not diaphoretic. There is cyanosis. No erythema. There is pallor. Nails show no clubbing.  Mottling of the feet; cyanosis of hands/fingers  Nursing note and vitals reviewed.   Labs reviewed:  Recent Labs  10/21/16 2046 11/19/16 1846 12/09/16 0656    NA 137 140 140  K 4.1 3.3* 3.3*  CL 102 102 102  CO2 28 28 30   GLUCOSE 132* 126* 119*  BUN 12 11 11   CREATININE 0.87 0.63 0.67  CALCIUM 8.9 9.2 9.1    Recent Labs  10/21/16 2046 11/19/16 1846 12/09/16 0656  AST 25 19 16   ALT 8* 12* 10*  ALKPHOS 91 92 89  BILITOT 0.7 0.7 0.9  PROT 7.0 6.7 6.6  ALBUMIN 4.0 3.9 4.1    Recent Labs  10/21/16 2046 11/19/16 1846 12/09/16 0656  WBC 7.8 7.0 5.4  NEUTROABS 4.3 3.8 2.5  HGB 12.1 11.4* 11.9*  HCT 36.6 34.0* 35.5  MCV 90.9 89.5 91.4  PLT 202 207 208   Lab Results  Component Value Date   TSH 1.518 07/10/2016   No results found for: HGBA1C Lab Results  Component Value Date   CHOL 209 (H) 10/12/2012   HDL 58 10/12/2012   LDLCALC 131 (H) 10/12/2012   TRIG 100 10/12/2012    Significant Diagnostic Results in last 30 days:  No results found.  Assessment/Plan 1. Dementia with behavioral disturbance, unspecified dementia type  Rapid decline  2. Cerebrovascular accident (CVA), unspecified mechanism (HCC)  Stable   3. Frequent falls  Safety/ fall precautions   4. Encounter for dying care  Continue previously ordered comfort meds  Acetaminophen 650 mg PR Q 4 hours prn- fever  Scopolamine patch 1.5 mg Q 3 days for secretions  Family/ staff Communication:   Total Time:  Documentation:  Face to Face:  Family/Phone:   Labs/tests ordered:    Medication list reviewed and assessed for continued appropriateness. Monthly medication orders reviewed and signed.  Brynda Rim, NP-C Geriatrics [facility] [phone] [address], [place] [address] Cell Phone (Mon-Fri 8am-5pm):  [phone] On Call:  [phone] & follow prompts after 5pm & weekends Office Phone:  [phone] Office Fax:  [phone]

## 2016-12-22 NOTE — Progress Notes (Signed)
Location:      Place of Service:  SNF (31) Provider:  Lorenso Quarry, NP-C  Lauro Regulus, MD  Patient Care Team: Lauro Regulus, MD as PCP - General (Internal Medicine)  Extended Emergency Contact Information Primary Emergency Contact: Mccleese,Dr.Richard Address: [address]          [place], [place] [address] [place] Home Phone: [phone] Mobile Phone: [phone] Relation: Spouse Secondary Emergency Contact: Carrell,Dr.David Address: [address] [place], [place] [address] [place] Home Phone: [phone] Relation: Son  Code Status:  DNR Goals of care: Advanced Directive information Advanced Directives 09/18/2016  Does Patient Have a Medical Advance Directive? No  Type of Advance Directive -  Does patient want to make changes to medical advance directive? No - Patient declined  Copy of Healthcare Power of Attorney in Chart? -  Would patient like information on creating a medical advance directive? No - Patient declined     Chief Complaint  Patient presents with  . Acute Visit    HPI:  Pt is a 83 y.o. female seen today for acute visit for fall today. Pt was also increasingly combative yesterday/ last night. At that time, she ws not easily redirected. Today, pt was found sitting on the floor in her room. She got up unassisted and fell straight down. She did not appear to hit her head. Pt is on the skill nursing unit for behaviors associated with dementia. She is combative at times. Gets up unassisted and falls frequently. She is pleasant and content to sit at the nurses desk during the day, but becomes restless and combative in the evening times. She falls most often in the early morning. Husband has provided a sitter for that time of day to assist pt with AM care. Multiple medication adjustments have been made lately. Pt seems to be responding to the Resperidol Consta the best. Sleeping more today. Not eating. Pt is  having regular BMs. VSS. Support and encouragement offered to husband. No other complaints.      Past Medical History:  Diagnosis Date  . Dementia   . Diverticulitis   . Glaucoma   . UTI (urinary tract infection)    Past Surgical History:  Procedure Laterality Date  . cholecytectomy    . EYE SURGERY      Allergies  Allergen Reactions  . Penicillins Anaphylaxis    All cillins Has patient had a PCN reaction causing immediate rash, facial/tongue/throat swelling, SOB or lightheadedness with hypotension: Yes Has patient had a PCN reaction causing severe rash involving mucus membranes or skin necrosis: No Has patient had a PCN reaction that required hospitalization Yes Has patient had a PCN reaction occurring within the last 10 years: No If all of the above answers are "NO", then may proceed with Cephalosporin use.     Allergies as of 12/15/2016      Reactions   Penicillins Anaphylaxis   All cillins Has patient had a PCN reaction causing immediate rash, facial/tongue/throat swelling, SOB or lightheadedness with hypotension: Yes Has patient had a PCN reaction causing severe rash involving mucus membranes or skin necrosis: No Has patient had a PCN reaction that required hospitalization Yes Has patient had a PCN reaction occurring within the last 10 years: No If all of the above answers are "NO", then may proceed with Cephalosporin use.      Medication List  Accurate as of 12/15/16 11:59 PM. Always use your most recent med list.          alum & mag hydroxide-simeth 200-200-20 MG/5ML suspension Commonly known as:  MAALOX/MYLANTA Take 30 mLs by mouth every 4 (four) hours as needed for indigestion or heartburn.   bimatoprost 0.01 % Soln Commonly known as:  LUMIGAN Place 1 drop into the left eye at bedtime.   brimonidine 0.2 % ophthalmic solution Commonly known as:  ALPHAGAN Place into the left eye 2 (two) times daily.   dicyclomine 20 MG tablet Commonly known as:   BENTYL Take 20 mg by mouth every 6 (six) hours.   donepezil 5 MG tablet Commonly known as:  ARICEPT Take 5 mg by mouth at bedtime.   dorzolamide-timolol 22.3-6.8 MG/ML ophthalmic solution Commonly known as:  COSOPT Place 1 drop into the left eye 2 (two) times daily.   hydrALAZINE 10 MG tablet Commonly known as:  APRESOLINE Take 1 tablet (10 mg total) by mouth 3 (three) times daily as needed (prn blood pressure greater than 180/90).   LamoTRIgine 50 MG Tbdp Take 50-100 mg by mouth 2 (two) times daily. tk 50 mg qam and 100 mg qhs   LORazepam 0.5 MG tablet Commonly known as:  ATIVAN Take 0.5 mg by mouth daily. 1700   naproxen 250 MG tablet Commonly known as:  NAPROSYN Take 250 mg by mouth 2 (two) times daily with a meal.   QUEtiapine 50 MG tablet Commonly known as:  SEROQUEL Take 50 mg by mouth 2 (two) times daily.   sucralfate 1 g tablet Commonly known as:  CARAFATE Take 1 g by mouth 4 (four) times daily.   traZODone 50 MG tablet Commonly known as:  DESYREL Take 50 mg by mouth at bedtime.   vitamin B-12 1000 MCG tablet Commonly known as:  CYANOCOBALAMIN Take 1,000 mcg by mouth daily.       Review of Systems  Constitutional: Negative for activity change, appetite change, chills, diaphoresis and fever.  HENT: Negative for congestion, sneezing, sore throat, trouble swallowing and voice change.   Respiratory: Negative for apnea, cough, choking, chest tightness, shortness of breath and wheezing.   Cardiovascular: Negative for chest pain, palpitations and leg swelling.  Gastrointestinal: Negative for abdominal distention, abdominal pain, constipation, diarrhea and nausea.  Genitourinary: Negative for difficulty urinating, dysuria, frequency and urgency.  Musculoskeletal: Negative for back pain, gait problem and myalgias. Arthralgias: typical arthritis.  Skin: Negative for color change, pallor, rash and wound.  Neurological: Negative for dizziness, tremors, syncope,  speech difficulty, weakness, numbness and headaches.  Psychiatric/Behavioral: Positive for agitation, behavioral problems and confusion.  All other systems reviewed and are negative.    There is no immunization history on file for this patient. Pertinent  Health Maintenance Due  Topic Date Due  . DEXA SCAN  06/20/1990  . PNA vac Low Risk Adult (1 of 2 - PCV13) 06/20/1990  . INFLUENZA VACCINE  02/04/2017   No flowsheet data found. Functional Status Survey:    Vitals:   12/15/16 0605  BP: (!) 162/81  Pulse: 81  Resp: 16  Temp: (!) 96.3 F (35.7 C)  SpO2: 94%   There is no height or weight on file to calculate BMI. Physical Exam  Constitutional: She is oriented to person, place, and time. Vital signs are normal. She appears well-developed and well-nourished. She is active and cooperative. She does not appear ill. No distress.  HENT:  Head: Normocephalic and atraumatic.  Mouth/Throat: Uvula is  midline, oropharynx is clear and moist and mucous membranes are normal. Mucous membranes are not pale, not dry and not cyanotic.  Eyes: Conjunctivae, EOM and lids are normal. Pupils are equal, round, and reactive to light.  Neck: Trachea normal, normal range of motion and full passive range of motion without pain. Neck supple. No JVD present. No tracheal deviation, no edema and no erythema present. No thyromegaly present.  Cardiovascular: Normal rate, regular rhythm, normal heart sounds, intact distal pulses and normal pulses.  Exam reveals no gallop, no distant heart sounds and no friction rub.   No murmur heard. 2+ BLE- pitting edema  Pulmonary/Chest: Effort normal and breath sounds normal. No accessory muscle usage. No respiratory distress. She has no decreased breath sounds. She has no wheezes. She has no rhonchi. She has no rales. She exhibits no tenderness.  Abdominal: Normal appearance and bowel sounds are normal. She exhibits no distension and no ascites. There is no tenderness.    Musculoskeletal: Normal range of motion. She exhibits no edema or tenderness.  Expected osteoarthritis, stiffness  Neurological: She is alert and oriented to person, place, and time. She has normal strength.  Skin: Skin is warm, dry and intact. No rash noted. She is not diaphoretic. No cyanosis or erythema. No pallor. Nails show no clubbing.  Psychiatric: Her mood appears anxious. Her affect is angry. Her speech is tangential. She is agitated, aggressive, actively hallucinating and combative. Thought content is delusional. Cognition and memory are impaired. She expresses impulsivity and inappropriate judgment. She exhibits abnormal recent memory.  Nursing note and vitals reviewed.   Labs reviewed:  Recent Labs  10/21/16 2046 11/19/16 1846 12/09/16 0656  NA 137 140 140  K 4.1 3.3* 3.3*  CL 102 102 102  CO2 28 28 30   GLUCOSE 132* 126* 119*  BUN 12 11 11   CREATININE 0.87 0.63 0.67  CALCIUM 8.9 9.2 9.1    Recent Labs  10/21/16 2046 11/19/16 1846 12/09/16 0656  AST 25 19 16   ALT 8* 12* 10*  ALKPHOS 91 92 89  BILITOT 0.7 0.7 0.9  PROT 7.0 6.7 6.6  ALBUMIN 4.0 3.9 4.1    Recent Labs  10/21/16 2046 11/19/16 1846 12/09/16 0656  WBC 7.8 7.0 5.4  NEUTROABS 4.3 3.8 2.5  HGB 12.1 11.4* 11.9*  HCT 36.6 34.0* 35.5  MCV 90.9 89.5 91.4  PLT 202 207 208   Lab Results  Component Value Date   TSH 1.518 07/10/2016   No results found for: HGBA1C Lab Results  Component Value Date   CHOL 209 (H) 10/12/2012   HDL 58 10/12/2012   LDLCALC 131 (H) 10/12/2012   TRIG 100 10/12/2012    Significant Diagnostic Results in last 30 days:  No results found.  Assessment/Plan 1. Dementia with behavioral disturbance, unspecified dementia type  Continue lorazepam to 0.5 mg po Q 4 hours prn  Clonazepam 0.5 mg po BID for agitation  Continue Risperidol Consta Q 2 weeks  Safety/ fall precautions  Encourage po Fluid and food intake  Haldol 2.5 mg IM daily prn  2. Frequent  Falls  Continue daily sitter  Continue safety/fall precautions  DC fall mat> trip hazard  Have pt at nurses station during waking hours  Family/ staff Communication:   Total Time:  Documentation:  Face to Face:  Family/Phone:   Labs/tests ordered:    Medication list reviewed and assessed for continued appropriateness. Monthly medication orders reviewed and signed.  Brynda RimShannon H. Joell Buerger, NP-C Geriatrics [facility] [address], [place] [address] Cell Phone (Mon-Fri 8am-5pm):  [phone] On Call:  [phone] & follow prompts after 5pm & weekends Office Phone:  [phone] Office Fax:  [phone]

## 2016-12-22 NOTE — Progress Notes (Signed)
Location:      Place of Service:  SNF (31) Provider:  Toni Arthurs, NP-C  Kirk Ruths, MD  Patient Care Team: Kirk Ruths, MD as PCP - General (Internal Medicine)  Extended Emergency Contact Information Primary Emergency Contact: Finamore,Dr.Richard Address: [address] [place]          [place], [place] [address] Johnnette Litter of [place] Phone: [phone] Mobile Phone: [phone] Relation: Spouse Secondary Emergency Contact: Ackert,Dr.David Address: [address] [place], [place] [address] Johnnette Litter of [place] Phone: [phone] Relation: Son  Code Status:  DNR Goals of care: Advanced Directive information Advanced Directives 09/18/2016  Does Patient Have a Medical Advance Directive? No  Type of Advance Directive -  Does patient want to make changes to medical advance directive? No - Patient declined  Copy of [place] in Chart? -  Would patient like information on creating a medical advance directive? No - Patient declined     Chief Complaint  Patient presents with  . Medical Management of Chronic Issues    HPI:  Pt is a 83 y.o. female seen today for medical management of chronic diseases. Pt is on the skill nursing unit for behaviors associated with dementia. She is combative at times. Gets up unassisted and falls frequently. She is pleasant and content to sit at the nurses desk during the day, but becomes restless and combative in the evening times. She falls most often in the early morning. Husband has provided a sitter for that time of day to assist pt with AM care. Multiple medication adjustments have been made lately. Pt seems to be responding to the Resperidol Consta the best. Sleeping more today. Will wean back the lorazepam. Pt is having regular BMs. VSS. Support and encouragement offered to husband. No other complaints.      Past Medical History:  Diagnosis Date  . Dementia   . Diverticulitis   .  Glaucoma   . UTI (urinary tract infection)    Past Surgical History:  Procedure Laterality Date  . cholecytectomy    . EYE SURGERY      Allergies  Allergen Reactions  . Penicillins Anaphylaxis    All cillins Has patient had a PCN reaction causing immediate rash, facial/tongue/throat swelling, SOB or lightheadedness with hypotension: Yes Has patient had a PCN reaction causing severe rash involving mucus membranes or skin necrosis: No Has patient had a PCN reaction that required hospitalization Yes Has patient had a PCN reaction occurring within the last 10 years: No If all of the above answers are "NO", then may proceed with Cephalosporin use.     Allergies as of 12/10/2016      Reactions   Penicillins Anaphylaxis   All cillins Has patient had a PCN reaction causing immediate rash, facial/tongue/throat swelling, SOB or lightheadedness with hypotension: Yes Has patient had a PCN reaction causing severe rash involving mucus membranes or skin necrosis: No Has patient had a PCN reaction that required hospitalization Yes Has patient had a PCN reaction occurring within the last 10 years: No If all of the above answers are "NO", then may proceed with Cephalosporin use.      Medication List       Accurate as of 12/10/16 11:59 PM. Always use your most recent med list.          alum & mag hydroxide-simeth 200-200-20 MG/5ML suspension Commonly known as:  MAALOX/MYLANTA Take 30 mLs  by mouth every 4 (four) hours as needed for indigestion or heartburn.   bimatoprost 0.01 % Soln Commonly known as:  LUMIGAN Place 1 drop into the left eye at bedtime.   brimonidine 0.2 % ophthalmic solution Commonly known as:  ALPHAGAN Place into the left eye 2 (two) times daily.   dicyclomine 20 MG tablet Commonly known as:  BENTYL Take 20 mg by mouth every 6 (six) hours.   donepezil 5 MG tablet Commonly known as:  ARICEPT Take 5 mg by mouth at bedtime.   dorzolamide-timolol 22.3-6.8 MG/ML  ophthalmic solution Commonly known as:  COSOPT Place 1 drop into the left eye 2 (two) times daily.   hydrALAZINE 10 MG tablet Commonly known as:  APRESOLINE Take 1 tablet (10 mg total) by mouth 3 (three) times daily as needed (prn blood pressure greater than 180/90).   LamoTRIgine 50 MG Tbdp Take 50-100 mg by mouth 2 (two) times daily. tk 50 mg qam and 100 mg qhs   LORazepam 0.5 MG tablet Commonly known as:  ATIVAN Take 0.5 mg by mouth daily. 1700   naproxen 250 MG tablet Commonly known as:  NAPROSYN Take 250 mg by mouth 2 (two) times daily with a meal.   QUEtiapine 50 MG tablet Commonly known as:  SEROQUEL Take 50 mg by mouth 2 (two) times daily.   sucralfate 1 g tablet Commonly known as:  CARAFATE Take 1 g by mouth 4 (four) times daily.   traZODone 50 MG tablet Commonly known as:  DESYREL Take 50 mg by mouth at bedtime.   vitamin B-12 1000 MCG tablet Commonly known as:  CYANOCOBALAMIN Take 1,000 mcg by mouth daily.       Review of Systems  Constitutional: Negative for activity change, appetite change, chills, diaphoresis and fever.  HENT: Negative for congestion, sneezing, sore throat, trouble swallowing and voice change.   Respiratory: Negative for apnea, cough, choking, chest tightness, shortness of breath and wheezing.   Cardiovascular: Negative for chest pain, palpitations and leg swelling.  Gastrointestinal: Negative for abdominal distention, abdominal pain, constipation, diarrhea and nausea.  Genitourinary: Negative for difficulty urinating, dysuria, frequency and urgency.  Musculoskeletal: Negative for back pain, gait problem and myalgias. Arthralgias: typical arthritis.  Skin: Negative for color change, pallor, rash and wound.  Neurological: Negative for dizziness, tremors, syncope, speech difficulty, weakness, numbness and headaches.  Psychiatric/Behavioral: Positive for agitation, behavioral problems and hallucinations.  All other systems reviewed and are  negative.    There is no immunization history on file for this patient. Pertinent  Health Maintenance Due  Topic Date Due  . DEXA SCAN  06/20/1990  . PNA vac Low Risk Adult (1 of 2 - PCV13) 06/20/1990  . INFLUENZA VACCINE  02/04/2017   No flowsheet data found. Functional Status Survey:    Vitals:   12/10/16 0550  BP: (!) 147/83  Pulse: 69  Resp: 18  Temp: 97.9 F (36.6 C)  SpO2: 97%   There is no height or weight on file to calculate BMI. Physical Exam  Constitutional: She is oriented to person, place, and time. Vital signs are normal. She appears well-developed and well-nourished. She is active and cooperative. She does not appear ill. No distress.  HENT:  Head: Normocephalic and atraumatic.  Mouth/Throat: Uvula is midline, oropharynx is clear and moist and mucous membranes are normal. Mucous membranes are not pale, not dry and not cyanotic.  Eyes: Conjunctivae, EOM and lids are normal. Pupils are equal, round, and reactive to light.  Neck: Trachea normal, normal range of motion and full passive range of motion without pain. Neck supple. No JVD present. No tracheal deviation, no edema and no erythema present. No thyromegaly present.  Cardiovascular: Normal rate, regular rhythm, normal heart sounds, intact distal pulses and normal pulses.  Exam reveals no gallop, no distant heart sounds and no friction rub.   No murmur heard. 2+ BLE- pitting edema  Pulmonary/Chest: Effort normal and breath sounds normal. No accessory muscle usage. No respiratory distress. She has no decreased breath sounds. She has no wheezes. She has no rhonchi. She has no rales. She exhibits no tenderness.  Abdominal: Normal appearance and bowel sounds are normal. She exhibits no distension and no ascites. There is no tenderness.  Musculoskeletal: Normal range of motion. She exhibits no edema or tenderness.  Expected osteoarthritis, stiffness  Neurological: She is alert and oriented to person, place, and  time. She has normal strength.  Skin: Skin is warm, dry and intact. No rash noted. She is not diaphoretic. No cyanosis or erythema. No pallor. Nails show no clubbing.  Psychiatric: Her mood appears anxious. Her affect is angry. Her speech is tangential. She is agitated, aggressive, actively hallucinating and combative. Thought content is delusional. Cognition and memory are impaired. She expresses impulsivity and inappropriate judgment. She exhibits abnormal recent memory.  Nursing note and vitals reviewed.   Labs reviewed:  Recent Labs  10/21/16 2046 11/19/16 1846 12/09/16 0656  NA 137 140 140  K 4.1 3.3* 3.3*  CL 102 102 102  CO2 28 28 30   GLUCOSE 132* 126* 119*  BUN 12 11 11   CREATININE 0.87 0.63 0.67  CALCIUM 8.9 9.2 9.1    Recent Labs  10/21/16 2046 11/19/16 1846 12/09/16 0656  AST 25 19 16   ALT 8* 12* 10*  ALKPHOS 91 92 89  BILITOT 0.7 0.7 0.9  PROT 7.0 6.7 6.6  ALBUMIN 4.0 3.9 4.1    Recent Labs  10/21/16 2046 11/19/16 1846 12/09/16 0656  WBC 7.8 7.0 5.4  NEUTROABS 4.3 3.8 2.5  HGB 12.1 11.4* 11.9*  HCT 36.6 34.0* 35.5  MCV 90.9 89.5 91.4  PLT 202 207 208   Lab Results  Component Value Date   TSH 1.518 07/10/2016   No results found for: HGBA1C Lab Results  Component Value Date   CHOL 209 (H) 10/12/2012   HDL 58 10/12/2012   LDLCALC 131 (H) 10/12/2012   TRIG 100 10/12/2012    Significant Diagnostic Results in last 30 days:  No results found.  Assessment/Plan 1. Dementia with behavioral disturbance, unspecified dementia type  DC scheduled Lorazepam  Change lorazepam to 0.5 mg po Q 4 hours prn  Clonazepam 0.5 mg po BID for agitation  DC Bentyl  Continue Risperidol Consta Q 2 weeks  Safety/ fall precautions  Encourage po Fluid and food intake  Family/ staff Communication:   Total Time:  Documentation:  Face to Face:  Family/Phone:   Labs/tests ordered:  Cbc, met C   Medication list reviewed and assessed for continued  appropriateness. Monthly medication orders reviewed and signed.  Vikki Ports, NP-C Geriatrics [facility] [phone] [address], [place] [address] Cell Phone (Mon-Fri 8am-5pm):  [phone] On Call:  [phone] & follow prompts after 5pm & weekends Office Phone:  [phone] Office Fax:  [phone]

## 2016-12-22 NOTE — Progress Notes (Signed)
Location:      Place of Service:  SNF (31) Provider:  Lorenso Quarry, NP-C  Lauro Regulus, MD  Patient Care Team: Lauro Regulus, MD as PCP - General (Internal Medicine)  Extended Emergency Contact Information Primary Emergency Contact: Oyervides,Dr.Richard Address: [address]          [place], [place] [address] Darden Amber of [place] Home Phone: [phone] Mobile Phone: [phone] Relation: Spouse Secondary Emergency Contact: Shurtz,Dr.David Address: [address] [place], [place] [address] Darden Amber of [place] Home Phone: [phone] Relation: Son  Code Status:  DNR Goals of care: Advanced Directive information Advanced Directives 09/18/2016  Does Patient Have a Medical Advance Directive? No  Type of Advance Directive -  Does patient want to make changes to medical advance directive? No - Patient declined  Copy of Healthcare Power of Attorney in Chart? -  Would patient like information on creating a medical advance directive? No - Patient declined     Chief Complaint  Patient presents with  . Follow-up    HPI:  Pt is a 83 y.o. female seen today for follow up. Pt had a suspected CVA 2 days ago. She has since been unresponsive, not eating, not drinking. Husband has opted to defer Hospice services at this time d/t short life expectancy. Pt appears comfortable at this time. She is having cyanosis of the hands/fingers. Beginning mottling of the feet. Husband appears to be grieving appropriately. O2 sats low. Otherwise, VSS. No other complaints.     Past Medical History:  Diagnosis Date  . Dementia   . Diverticulitis   . Glaucoma   . UTI (urinary tract infection)    Past Surgical History:  Procedure Laterality Date  . cholecytectomy    . EYE SURGERY      Allergies  Allergen Reactions  . Penicillins Anaphylaxis    All cillins Has patient had a PCN reaction causing immediate rash, facial/tongue/throat swelling, SOB or  lightheadedness with hypotension: Yes Has patient had a PCN reaction causing severe rash involving mucus membranes or skin necrosis: No Has patient had a PCN reaction that required hospitalization Yes Has patient had a PCN reaction occurring within the last 10 years: No If all of the above answers are "NO", then may proceed with Cephalosporin use.     Allergies as of 12/18/2016      Reactions   Penicillins Anaphylaxis   All cillins Has patient had a PCN reaction causing immediate rash, facial/tongue/throat swelling, SOB or lightheadedness with hypotension: Yes Has patient had a PCN reaction causing severe rash involving mucus membranes or skin necrosis: No Has patient had a PCN reaction that required hospitalization Yes Has patient had a PCN reaction occurring within the last 10 years: No If all of the above answers are "NO", then may proceed with Cephalosporin use.      Medication List       Accurate as of 12/18/16 11:59 PM. Always use your most recent med list.          alum & mag hydroxide-simeth 200-200-20 MG/5ML suspension Commonly known as:  MAALOX/MYLANTA Take 30 mLs by mouth every 4 (four) hours as needed for indigestion or heartburn.   bimatoprost 0.01 % Soln Commonly known as:  LUMIGAN Place 1 drop into the left eye at bedtime.   brimonidine 0.2 % ophthalmic solution Commonly known as:  ALPHAGAN Place into the left eye 2 (two) times daily.   dicyclomine  20 MG tablet Commonly known as:  BENTYL Take 20 mg by mouth every 6 (six) hours.   donepezil 5 MG tablet Commonly known as:  ARICEPT Take 5 mg by mouth at bedtime.   dorzolamide-timolol 22.3-6.8 MG/ML ophthalmic solution Commonly known as:  COSOPT Place 1 drop into the left eye 2 (two) times daily.   hydrALAZINE 10 MG tablet Commonly known as:  APRESOLINE Take 1 tablet (10 mg total) by mouth 3 (three) times daily as needed (prn blood pressure greater than 180/90).   LamoTRIgine 50 MG Tbdp Take 50-100 mg  by mouth 2 (two) times daily. tk 50 mg qam and 100 mg qhs   LORazepam 0.5 MG tablet Commonly known as:  ATIVAN Take 0.5 mg by mouth daily. 1700   naproxen 250 MG tablet Commonly known as:  NAPROSYN Take 250 mg by mouth 2 (two) times daily with a meal.   QUEtiapine 50 MG tablet Commonly known as:  SEROQUEL Take 50 mg by mouth 2 (two) times daily.   sucralfate 1 g tablet Commonly known as:  CARAFATE Take 1 g by mouth 4 (four) times daily.   traZODone 50 MG tablet Commonly known as:  DESYREL Take 50 mg by mouth at bedtime.   vitamin B-12 1000 MCG tablet Commonly known as:  CYANOCOBALAMIN Take 1,000 mcg by mouth daily.       Review of Systems  Unable to perform ROS: Patient unresponsive  Constitutional: Positive for activity change. Negative for appetite change, chills, diaphoresis and fever.  Respiratory: Negative.  Negative for apnea, cough, choking, chest tightness, shortness of breath and wheezing.   Cardiovascular: Negative.  Negative for chest pain, palpitations and leg swelling.  Musculoskeletal: Arthralgias: typical arthritis.  Skin: Negative for color change, pallor, rash and wound.  Neurological: Negative for dizziness, tremors, syncope, facial asymmetry, speech difficulty, weakness, numbness and headaches.  Psychiatric/Behavioral: Negative for agitation, behavioral problems and confusion.  All other systems reviewed and are negative.    There is no immunization history on file for this patient. Pertinent  Health Maintenance Due  Topic Date Due  . DEXA SCAN  06/20/1990  . PNA vac Low Risk Adult (1 of 2 - PCV13) 06/20/1990  . INFLUENZA VACCINE  02/04/2017   No flowsheet data found. Functional Status Survey:    Vitals:   12/18/16 0730  BP: 140/61  Pulse: 88  Resp: 14  Temp: 98.3 F (36.8 C)  SpO2: (!) 81%   There is no height or weight on file to calculate BMI. Physical Exam  Constitutional: Vital signs are normal. She appears well-developed and  well-nourished. She is active. She has a sickly appearance. She does not appear ill. No distress.  HENT:  Head: Normocephalic and atraumatic.  Mouth/Throat: Uvula is midline, oropharynx is clear and moist and mucous membranes are normal. Mucous membranes are not pale, not dry and not cyanotic.  Eyes: Conjunctivae and lids are normal. Pupils are equal, round, and reactive to light. Right eye exhibits abnormal extraocular motion and nystagmus. Left eye exhibits abnormal extraocular motion and nystagmus.  Neck: Trachea normal, normal range of motion and full passive range of motion without pain. Neck supple. No JVD present. No tracheal deviation, no edema and no erythema present. No thyromegaly present.  Cardiovascular: Normal rate and intact distal pulses.  An irregular rhythm present. Exam reveals distant heart sounds. Exam reveals no gallop and no friction rub.   No murmur heard. Pulses:      Dorsalis pedis pulses are 1+ on  the right side, and 1+ on the left side.  Pulmonary/Chest: Effort normal. No accessory muscle usage. No respiratory distress. She has decreased breath sounds in the right lower field and the left lower field. She has no wheezes. She has no rhonchi. She has no rales. She exhibits no tenderness.  Abdominal: Soft. Normal appearance. She exhibits no distension and no ascites. Bowel sounds are absent. There is no tenderness.  Musculoskeletal: Normal range of motion. She exhibits no edema or tenderness.  Expected osteoarthritis, stiffness  Neurological: She has normal strength. She is unresponsive.  Skin: Skin is warm, dry and intact. No rash noted. She is not diaphoretic. There is cyanosis. No erythema. There is pallor. Nails show no clubbing.  Mottling of the feet; cyanosis of hands/fingers  Nursing note and vitals reviewed.   Labs reviewed:  Recent Labs  10/21/16 2046 11/19/16 1846 12/09/16 0656  NA 137 140 140  K 4.1 3.3* 3.3*  CL 102 102 102  CO2 28 28 30   GLUCOSE  132* 126* 119*  BUN 12 11 11   CREATININE 0.87 0.63 0.67  CALCIUM 8.9 9.2 9.1    Recent Labs  10/21/16 2046 11/19/16 1846 12/09/16 0656  AST 25 19 16   ALT 8* 12* 10*  ALKPHOS 91 92 89  BILITOT 0.7 0.7 0.9  PROT 7.0 6.7 6.6  ALBUMIN 4.0 3.9 4.1    Recent Labs  10/21/16 2046 11/19/16 1846 12/09/16 0656  WBC 7.8 7.0 5.4  NEUTROABS 4.3 3.8 2.5  HGB 12.1 11.4* 11.9*  HCT 36.6 34.0* 35.5  MCV 90.9 89.5 91.4  PLT 202 207 208   Lab Results  Component Value Date   TSH 1.518 07/10/2016   No results found for: HGBA1C Lab Results  Component Value Date   CHOL 209 (H) 10/12/2012   HDL 58 10/12/2012   LDLCALC 131 (H) 10/12/2012   TRIG 100 10/12/2012    Significant Diagnostic Results in last 30 days:  No results found.  Assessment/Plan 1. Cerebrovascular accident (CVA), unspecified mechanism (HCC)  Continue comfort care  2. Encounter for dying care  Emotional support for husband  Oxygen 2-5 L/min. Titrate for comfort   Family/ staff Communication:   Total Time:  Documentation:  Face to Face:  Family/Phone:   Labs/tests ordered:    Medication list reviewed and assessed for continued appropriateness. Monthly medication orders reviewed and signed.  Brynda Rim, NP-C Geriatrics [facility] [phone] [address], [place] [address] Cell Phone (Mon-Fri 8am-5pm):  [phone] On Call:  [phone] & follow prompts after 5pm & weekends Office Phone:  [phone] Office Fax:  [phone]

## 2017-01-04 DEATH — deceased

## 2017-06-20 IMAGING — CT CT HEAD W/O CM
5 of 8 series · 16 of 47 positions shown, 17 images · non-contrast
Comparison: 09/18/2016

CLINICAL DATA: Unwitnessed fall. Loss of consciousness. Back of
head hurts.

EXAM:
CT HEAD WITHOUT CONTRAST
CT CERVICAL SPINE WITHOUT CONTRAST
TECHNIQUE: Multidetector CT imaging of the head and cervical spine was
performed following the standard protocol without intravenous
contrast. Multiplanar CT image reconstructions of the cervical spine
were also generated.

[Series 2: head wo · axial · 0.43mm/px · z∈[+217,+262]mm · 2 of 29 slices shown, 3 images]
[im 10/29  brain]
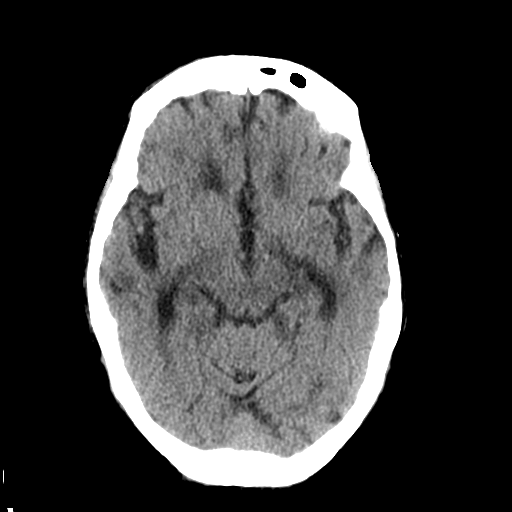
[im 10/29  bone]
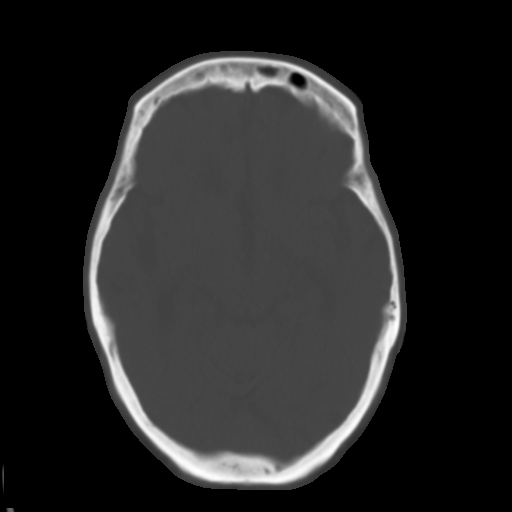
[im 19/29  brain]
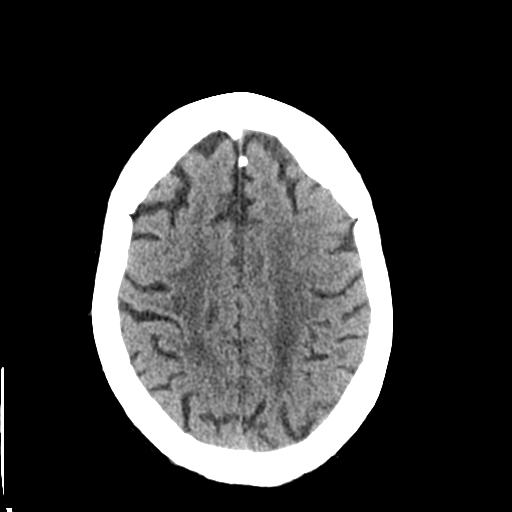

[Series 3: head bone · axial · 0.43mm/px · z∈[+192,+232]mm · 3 of 73 slices shown]
[im 11/73  bone]
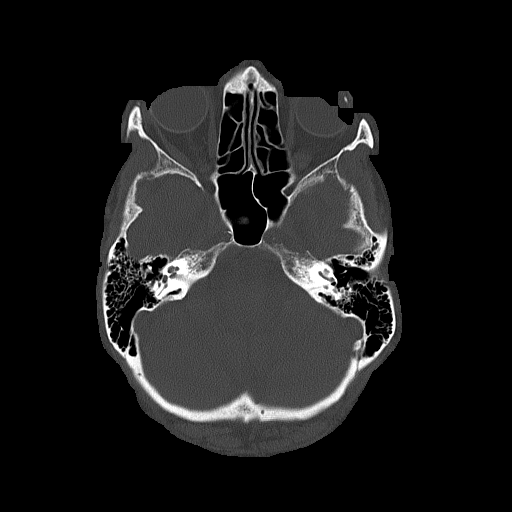
[im 21/73  bone]
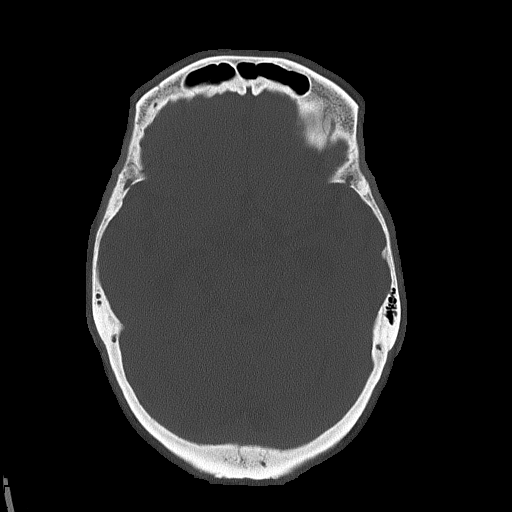
[im 31/73  bone]
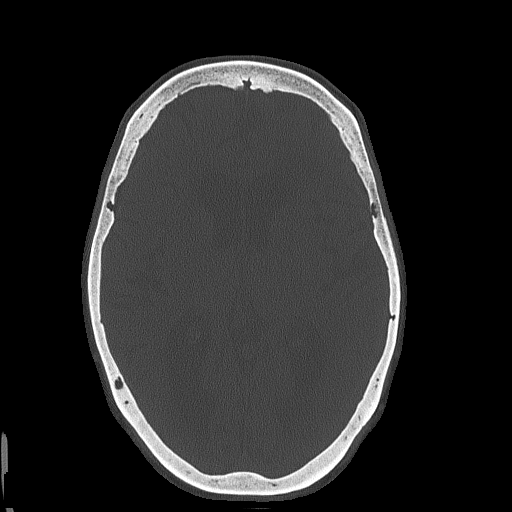

[Series 4: coronal soft tissue · coronal · 0.28mm/px · 3 of 62 slices shown]
[im 2/62  brain]
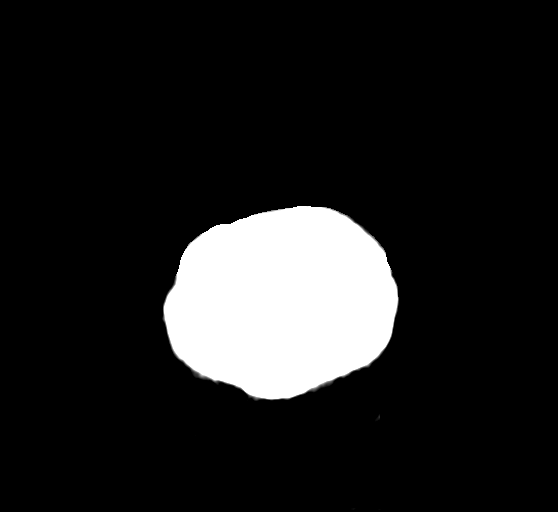
[im 4/62  brain]
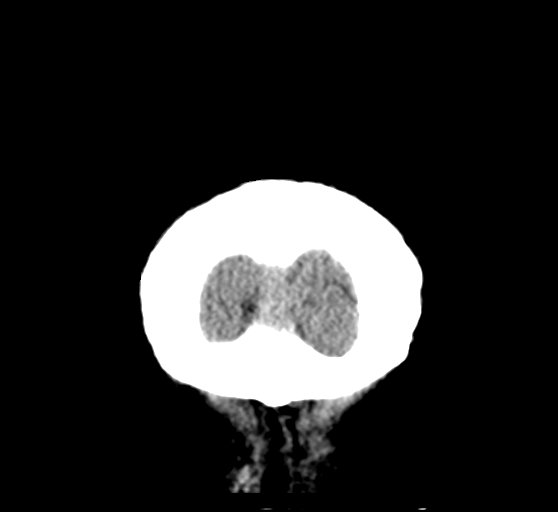
[im 6/62  brain]
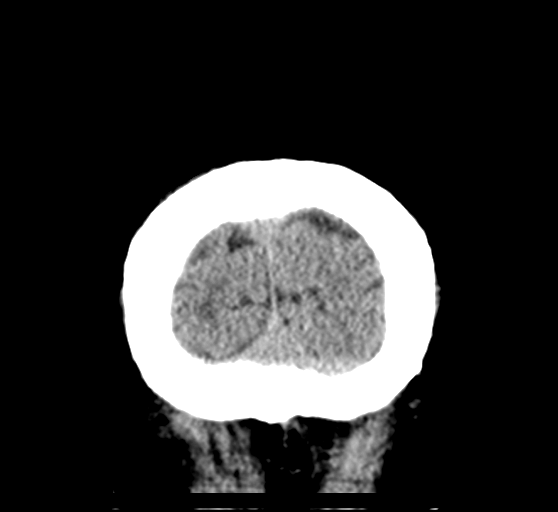

[Series 5: sagittal soft tissue · sagittal · 0.28mm/px · 1 of 48 slices shown]
[im 24/48  brain]
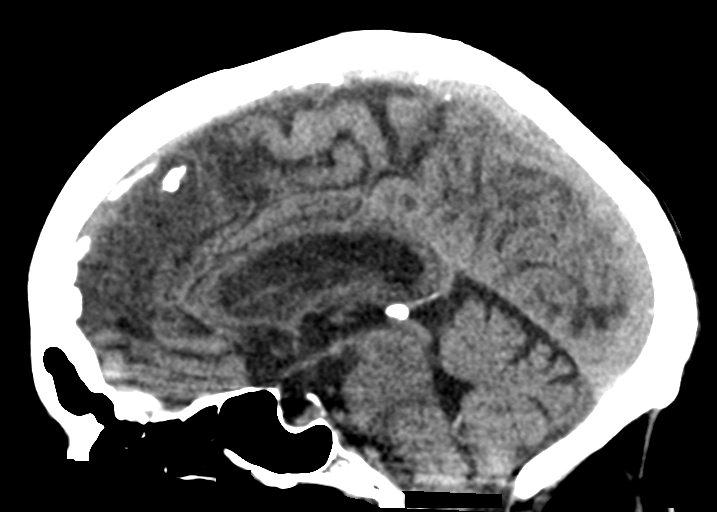

[Series 10: orthogonal bone · axial · 0.23mm/px · z∈[+22,+129]mm · 7 of 80 slices shown]
[im 10/80  bone]
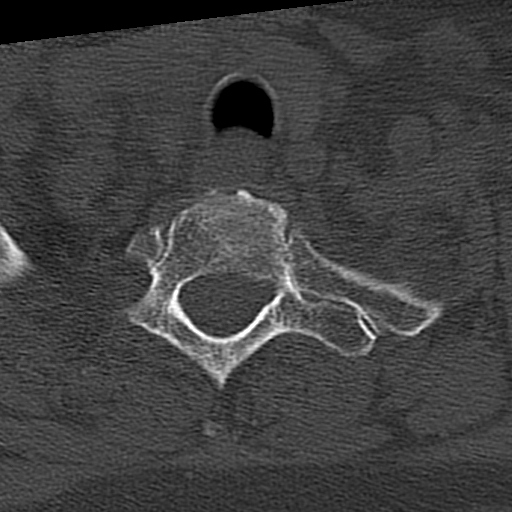
[im 20/80  bone]
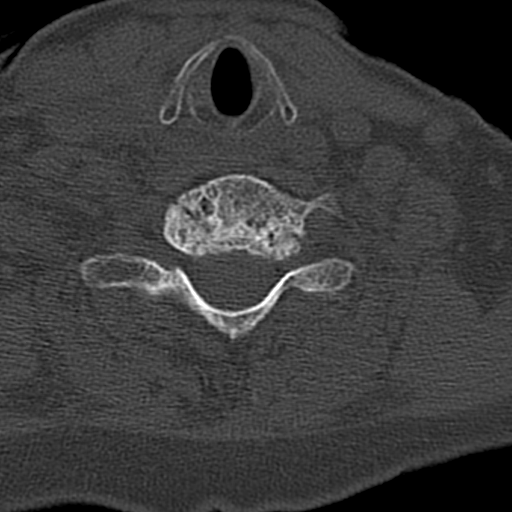
[im 30/80  bone]
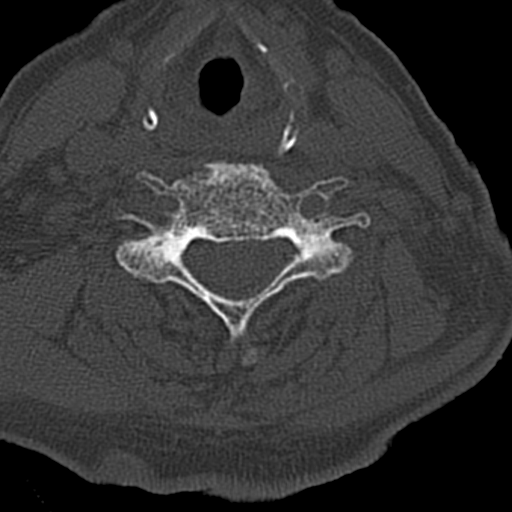
[im 40/80  bone]
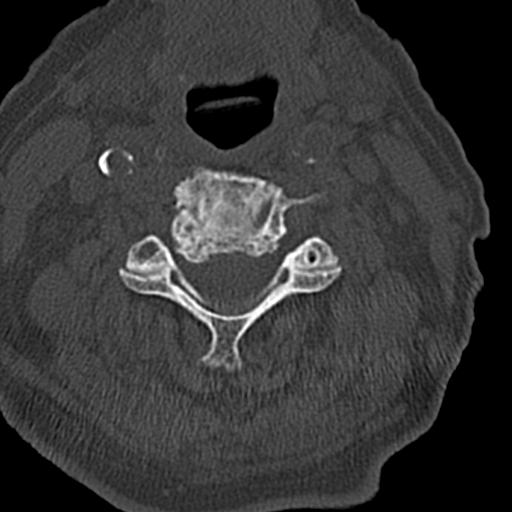
[im 50/80  bone]
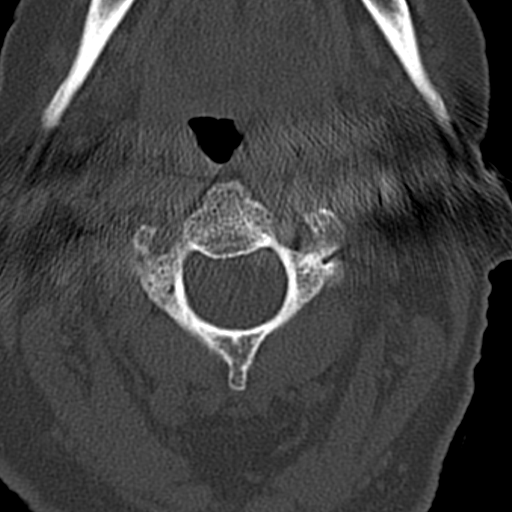
[im 60/80  bone]
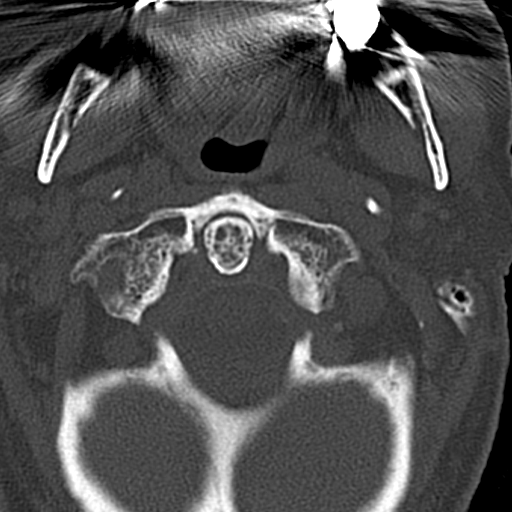
[im 70/80  bone]
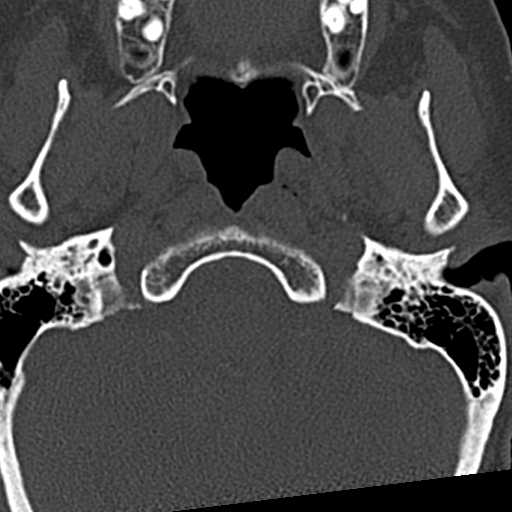

[16 of 47 positions shown; findings below may reference images not displayed]

FINDINGS: CT HEAD FINDINGS

Brain: Diffuse cerebral atrophy. Ventricular dilatation consistent
with central atrophy. Low-attenuation changes in the deep white
matter consistent with small vessel ischemia. No mass effect or
midline shift. No abnormal extra-axial fluid collections. Gray-white
matter junctions are distinct. Basal cisterns are not effaced. No
acute intracranial hemorrhage.

Vascular: Vascular calcifications are present. 6 mm bulbous
prominence of the left internal carotid bifurcation. This may just
be due to tortuous vascularity but intracranial aneurysm is not
excluded. Consider follow-up with CT angiography or MR angiography
for correlation. No acute hemorrhage.

Skull: The calvarium appears intact.  No depressed skull fractures.

Sinuses/Orbits: Postoperative changes in the globes. Paranasal
sinuses and mastoid air cells are clear.

Other: None.

CT CERVICAL SPINE FINDINGS

Alignment: Normal alignment of the cervical vertebrae and facet
joints. C1-2 articulation appears intact.

Skull base and vertebrae: Skullbase is unremarkable. No vertebral
compression deformities. No focal bone lesion or bone destruction.
Bone cortex appears intact. Subcortical degenerative changes at the
superior endplates at C5, C6, and C7.

Soft tissues and spinal canal: No prevertebral fluid or swelling. No
visible canal hematoma.

Disc levels: Degenerative changes throughout the cervical spine with
narrowed cervical interspaces and endplate hypertrophic changes
throughout. Degenerative changes throughout the facet joints.

Upper chest: Azygos lobe.  Lung apices are clear.

Other: Vascular calcifications in the cervical carotid arteries.
IMPRESSION: No acute intracranial abnormalities. Chronic atrophy and small
vessel ischemic changes. 6 mm bulbous prominence of the left
internal carotid bifurcation. This could be due to tortuous
vascularity but intracranial aneurysm is not excluded. Consider
follow-up with CT angiography or MR angiography for correlation. No
acute hemorrhage.

Normal alignment of the cervical spine. Diffuse degenerative
changes. No acute displaced fractures identified.
